# Patient Record
Sex: Female | Born: 1960 | Race: White | Hispanic: No | Marital: Married | Smoking: Former smoker
Health system: Southern US, Community
[De-identification: ages and names within clinical notes are randomized; demographics above are authoritative.]

## PROBLEM LIST (undated history)

## (undated) DIAGNOSIS — F419 Anxiety disorder, unspecified: Secondary | ICD-10-CM

## (undated) DIAGNOSIS — E785 Hyperlipidemia, unspecified: Secondary | ICD-10-CM

## (undated) DIAGNOSIS — I10 Essential (primary) hypertension: Secondary | ICD-10-CM

## (undated) HISTORY — DX: Anxiety disorder, unspecified: F41.9

## (undated) HISTORY — DX: Essential (primary) hypertension: I10

## (undated) HISTORY — DX: Hyperlipidemia, unspecified: E78.5

## (undated) HISTORY — PX: CHOLECYSTECTOMY: SHX55

---

## 2004-09-18 ENCOUNTER — Ambulatory Visit: Payer: Self-pay | Admitting: Family Medicine

## 2005-05-10 ENCOUNTER — Inpatient Hospital Stay (HOSPITAL_COMMUNITY): Admission: EM | Admit: 2005-05-10 | Discharge: 2005-05-12 | Payer: Self-pay

## 2005-05-10 ENCOUNTER — Encounter (INDEPENDENT_AMBULATORY_CARE_PROVIDER_SITE_OTHER): Payer: Self-pay | Admitting: Specialist

## 2005-05-11 ENCOUNTER — Ambulatory Visit: Payer: Self-pay | Admitting: Internal Medicine

## 2006-01-01 ENCOUNTER — Ambulatory Visit: Payer: Self-pay | Admitting: Family Medicine

## 2008-08-18 DIAGNOSIS — F5101 Primary insomnia: Secondary | ICD-10-CM | POA: Insufficient documentation

## 2009-01-31 ENCOUNTER — Ambulatory Visit: Payer: Self-pay | Admitting: Family Medicine

## 2009-01-31 LAB — HM MAMMOGRAPHY

## 2009-04-10 DIAGNOSIS — R0602 Shortness of breath: Secondary | ICD-10-CM

## 2009-04-10 DIAGNOSIS — R079 Chest pain, unspecified: Secondary | ICD-10-CM | POA: Insufficient documentation

## 2009-04-10 DIAGNOSIS — E7800 Pure hypercholesterolemia, unspecified: Secondary | ICD-10-CM

## 2009-04-17 ENCOUNTER — Ambulatory Visit: Payer: Self-pay | Admitting: Cardiovascular Disease

## 2010-03-27 NOTE — Assessment & Plan Note (Signed)
Summary: NEW PT   Visit Type:  New Patient Referring Provider:  Blythe Stanford, MD Primary Provider:  Blythe Stanford, MD  CC:  pain in neck, under lots of stress, and shortness of breath -  but out of breath. no edema in ankles and feet.Rebecca Greer  History of Present Illness: Ms. Rebecca Greer is a very pleasant 50 year old woman with a history of hyperlipidemia, shortness of breath with exertion, anxiety with recent episodes of chest discomfort radiating up to her shoulders she presents for evaluation. She is patient of Dr. Elease Hashimoto.  Ms. Windish states that she has started a new job at [employer] It is very stressful and she does have problems all day long. She started at 7:30 in the morning and finishes approximately at 5 PM and feels so tired that she can only sit and watch TV. She has no energy to do any walking or exercise. By the end of the week she is exhausted. She states having tightness in her chest that starts in the morning and lasts all day long into the evening. She was recently started on sertraline 50 mg daily and has been on it for less than one week. She's not noticed any significant changes. She does not have any significant chest discomfort with exertion. She is able to perform all of her ADLs without symptoms.  Preventive Screening-Counseling & Management  Alcohol-Tobacco     Alcohol drinks/day: 0     Smoking Status: quit     Packs/Day: 0.5     Year Quit: 1995  Caffeine-Diet-Exercise     Caffeine use/day: 1 cup      Does Patient Exercise: no      Drug Use:  no.    Current Problems (verified): 1)  Hypercholesterolemia Iia  (ICD-272.0) 2)  Shortness of Breath  (ICD-786.05) 3)  Chest Pain-unspecified  (ICD-786.50)  Current Medications (verified): 1)  Simvastatin 20 Mg Tabs (Simvastatin) .... Take One Tablet By Mouth Daily At Bedtime 2)  Zolpidem Tartrate 10 Mg Tabs (Zolpidem Tartrate) .Rebecca Greer.. 1 By Mouth Once Daily 3)  Sertraline Hcl 50 Mg Tabs (Sertraline Hcl) .Rebecca Greer.. 1 By Mouth Once  Daily  Allergies (verified): No Known Drug Allergies  Past History:  Past Medical History: Anxiety Hyperlipidemia Hypertension  Past Surgical History: gall bladder surgery  Family History: Father: living 45: heart problems Mother:living: healthy  Social History: Full Time Married  Tobacco Use - Former.  Alcohol Use - no Regular Exercise - no Drug Use - no Alcohol drinks/day:  0 Smoking Status:  quit Packs/Day:  0.5 Caffeine use/day:  1 cup  Does Patient Exercise:  no Drug Use:  no  Review of Systems       The patient complains of chest pain.  The patient denies anorexia, fever, weight loss, weight gain, vision loss, decreased hearing, hoarseness, syncope, dyspnea on exertion, peripheral edema, prolonged cough, headaches, hemoptysis, abdominal pain, melena, hematochezia, severe indigestion/heartburn, hematuria, incontinence, genital sores, muscle weakness, suspicious skin lesions, transient blindness, difficulty walking, depression, unusual weight change, abnormal bleeding, enlarged lymph nodes, angioedema, breast masses, and testicular masses.         Fatigue, anxiety  Vital Signs:  Patient profile:   50 year old female Height:      63 inches Weight:      177 pounds BMI:     31.47 Pulse rate:   82 / minute Pulse rhythm:   regular BP sitting:   130 / 86  (left arm) Cuff size:   regular  Vitals Entered  By: Mercer Pod (April 17, 2009 2:32 PM)  Physical Exam  General:  well-appearing young woman in no apparent distress, HEEN TMs benign, oropharynx is clear, neck is supple with no JVP or carotid bruits, heart sounds are regular with normal S1-S2 and no murmurs appreciated, lungs are clear to auscultation with no wheezes or rales, abdominal exam is benign, she has no significant lower extremity edema, neurologic exam is grossly nonfocal, skin is warm and dry, pulses are equal and symmetrical in her upper or lower extremities. She is alert and oriented  x3.    Impression & Recommendations:  Problem # 1:  CHEST PAIN-UNSPECIFIED (ICD-786.50) etiology of her chest pain is likely due to anxiety. It is atypical in presentation, lasting all day long and associated with rest. She has not had symptoms with exertion. There are no other associated symptoms with it including no sweating, shortness of breath or dizziness. I have encouraged her to increase her exercise as this may help her stress. I asked her to have her husband walk with her several times per week. If she has continued symptoms, with exertion, but asked her to contact our office for further evaluation. EKG is normal on today's visit.  Problem # 2:  SHORTNESS OF BREATH (ICD-786.05) I suspect that her shortness of breath is due to her deconditioned state. She even states herself that she is deconditioned. She needs to walk several times per week. Her weight is elevated and should be lower than its current level of 177 pounds.  Problem # 3:  HYPERCHOLESTEROLEMIA  IIA (ICD-272.0) she does have a family history of coronary artery disease. Father had bypass surgery in his early 58s. He was a long-time smoker. She stopped smoking 15 years ago. We'll try to obtain her most recent cholesterol panel from Dr. Elease Hashimoto for our records. Encouraged her to stay on the simvastatin given her family history and history of smoking. She is young for an aspirin though could consider this when she reaches 50 years old or sooner if diagnosed with coronary disease. Her updated medication list for this problem includes:    Simvastatin 20 Mg Tabs (Simvastatin) .Rebecca Greer... Take one tablet by mouth daily at bedtime

## 2010-03-27 NOTE — Progress Notes (Signed)
Summary: PHI  PHI   Imported By: Harlon Flor 04/19/2009 09:14:42  _____________________________________________________________________  External Attachment:    Type:   Image     Comment:   External Document

## 2010-07-13 NOTE — Op Note (Signed)
Rebecca Greer, Rebecca Greer NO.:  [id number]   MEDICAL RECORD NO.:  [id number]          PATIENT TYPE:  INP   LOCATION:  5703                         FACILITY:  [facility]   PHYSICIAN:  Clovis Pu. Cornett, M.D.DATE OF BIRTH:  [birth year]   DATE OF PROCEDURE:  05/10/2005  DATE OF DISCHARGE:  05/12/2005                                 OPERATIVE REPORT   PREOPERATIVE DIAGNOSIS:  Acute cholecystitis.   POSTOPERATIVE DIAGNOSIS:  Acute cholecystitis with common bile duct stone.   PROCEDURE:  Laparoscopic cholecystectomy with intraoperative cholangiogram.   SURGEON:  Harriette Bouillon, M.D.   ANESTHESIA:  General endotracheal anesthesia.   ESTIMATED BLOOD LOSS:  50 mL.   DRAIN:  A 19 Blake drain to gallbladder fossa.   SPECIMEN:  Gallbladder with gallstones to Pathology.   INDICATIONS FOR PROCEDURE:  The patient is a 50 year old female admitted  with upper quadrant pain and found to have signs of acute cholecystitis by  ultrasound and physical examination.  She had a slight bump in her liver  function studies as well.  She was brought to the operating room today for  laparoscopic cholecystectomy after informed consent that was obtained from  the patient.  Risks, complications and other alternative therapies were  discussed; the patient understood and agreed to proceed.   DESCRIPTION OF PROCEDURE:  The patient was brought to the operating room and  placed supine.  After induction of general endotracheal anesthesia, the  abdomen was prepped and draped in a sterile fashion.  A 1-cm supraumbilical  incision was made.  Dissection was carried down to her fascia.  A small  incision was made in the fascia and 2 graspers were used to grasp the fascia  and widen the incision.  I placed my finger through the incision through the  preperitoneal space into the peritoneal cavity and swept around.  A  pursestring suture of 0 Vicryl was placed and a 12-mm Hasson cannula was  placed under  direct vision.  Pneumoperitoneum was created to 15 mmHg with  CO2 and a laparoscope was placed.  Upon inspection of the abdominal cavity,  there were no signs of solid or hollow organ injury.  The gallbladder was  identified and found to show signs of acute inflammatory change.  A 5-mm  subxiphoid port was placed and 2 other 5-mm ports were placed in the right  mid-abdomen.  The dome of the gallbladder was identified and grasped; it was  quite tense and I had to decompress the gallbladder with a decompressing  suction needle.  This made much easier to grab.  The dome was then grasped  much easier now and retracted towards the patient's right shoulder.  Upon  inspection of the gallbladder, the common duct did appear dilated to a  centimeter.  I could see the cystic duct coursing off the common duct as  well.  I used the cautery at this point to score the peritoneum overlying  the junction of the cystic duct and common duct.  I was able to dissect out  the cystic duct circumferentially and it was the only tubular structure  entering the gallbladder.  Anterior to this was 1 small blood vessel that I  clipped and divided prior to dissecting out the cystic duct  circumferentially.  This was coursing on the cystic duct itself onto the  body of the gallbladder.  Once the cystic duct was identified, a clip was  placed on the gallbladder side.  Through a separate stab incision, a Cook  cholangiogram catheter was introduced.  A small incision was made in the  cystic duct and the cholangiogram catheter was placed and held in place with  a clip.  Intraoperative cholangiogram was obtained which showed free flow of  contrast material into the cystic duct and common duct to the duodenum.  There was flow up the common hepatic duct to the bifurcation of the right  and left hepatic ducts.  There was a filling defect that initially I thought  was an air bubble, but upon further inspection and repeat films,  this was  behaving more like a stone and I could not flush it out.  At this point in  time, I did not see any other filling defects, except for this one.  I  elected to go ahead and complete the cholangiogram.  I triple-clipped the  cystic duct stump and divided it.  The cystic artery was identified, double-  clipped and divided.  Cautery was used to dissect the gallbladder from the  gallbladder fossa with good hemostasis.  Using a 5-mm scope, an EndoCatch  bag was introduced through the umbilical port and the gallbladder was  subsequently removed and passed off the field.  I then reinspected the  gallbladder fossa with a 10-mm scope and used cautery to control any  bleeding.  Irrigation was used and suctioned out.  Clips were on the cystic  artery and cystic duct, respectively.  There was some edema from the  inflammation noted, but otherwise the gallbladder bed was dry.  I elected to  place a drain, since I had concern of a common duct stone, and through the  subxiphoid port, I was inserted 19 Blake drain and pulled this out of the  right lower quadrant through the right lower quadrant port.  I placed this  under the liver in the gallbladder fossa.  This was secured to the skin with  a 4-0 nylon.  At this point in time, I inspected the abdominal cavity and  saw no signs of active bleeding from the operative site nor injury to any  solid or hollow organs.  At this point in time, I then removed my ports, let  the CO2 escape and passed the camera off the field.  I then closed the  umbilical port with the previously placed pursestring suture of 0 Vicryl and  4-0 Monocryl was used to close all skin incisions.  The JP was placed was  placed to bulb-suction.  All final counts of sponge, needle and instruments  were found to be correct.  The patient was awoke and taken to Recovery in  satisfactory condition.      Thomas A. Cornett, M.D.  Electronically Signed    TAC/MEDQ  D:  05/10/2005   T:  05/13/2005  Job:  [id number]

## 2010-07-13 NOTE — Discharge Summary (Signed)
Rebecca Greer, Rebecca Greer NO.:  [id number]   MEDICAL RECORD NO.:  [id number]          PATIENT TYPE:  INP   LOCATION:  5703                         FACILITY:  [facility]   PHYSICIAN:  Clovis Pu. Cornett, M.D.DATE OF BIRTH:  18-Oct-1960   DATE OF ADMISSION:  05/09/2005  DATE OF DISCHARGE:  05/12/2005                                 DISCHARGE SUMMARY   ADMITTING DIAGNOSIS:  Cholecystitis.   DISCHARGE DIAGNOSIS:  Cholecystitis.   PROCEDURE PERFORMED:  1.  Laparoscopic cholecystectomy with cholangiogram.  2.  ERCP.   BRIEF HISTORY:  The patient is a 50 year old female admitted with  symptomatic cholelithiasis.  She also had elevated liver function studies  and was admitted for laparoscopic cholecystectomy.   HOSPITAL COURSE:  The patient underwent laparoscopic cholecystectomy on  May 10, 2005.  Cholangiogram revealed some possible filling defects and GI  was consulted.  ERCP did not reveal any filling defects after ERCP was done.  She was kept in the hospital until May 12, 2005.  She was afebrile, doing  relatively well, tolerating her diet.  She still had a slight increase in  her total bilirubin of 1.3, but her amylase and lipase were also elevated at  1590 and 2350.  She had no pain or tenderness though and was discharged home  on May 12, 2005.   DISCHARGE INSTRUCTIONS:  She will follow up in 10 to 14 days.  She will be  given Percocet for pain.  She will resume regular activities in 7 to 14 days  and resume a regular diet after 10 to 14 days.   CONDITION ON DISCHARGE:  Satisfactory.      Thomas A. Cornett, M.D.  Electronically Signed     TAC/MEDQ  D:  07/05/2005  T:  07/05/2005  Job:  [id number]

## 2010-07-13 NOTE — Consult Note (Signed)
Rebecca Greer, Rebecca Greer NO.:  [id number]   MEDICAL RECORD NO.:  [id number]          PATIENT TYPE:  INP   LOCATION:  5703                         FACILITY:  [facility]   PHYSICIAN:  Lina Sar, M.D. [facility]  DATE OF BIRTH:  03-05-1960   DATE OF CONSULTATION:  05/11/2005  DATE OF DISCHARGE:                                   CONSULTATION   PROCEDURE:  ERCP, sphincterotomy and stone extraction.   INDICATIONS FOR PROCEDURE:  This 50 year old white female was admitted on an  emergency basis with acute abdominal pain, evidence of cholelithiasis.  Her  liver function tests were normal.  She underwent a laparoscopic  cholecystectomy on May 10, 2005, with findings of an abnormal  intraoperative cholangiogram suggesting retained stone in common bile duct.  Liver function tests were slightly abnormal this morning and patient  continued to have low grade pain.  She is undergoing ERCP and possible stone  extraction.   ENDOSCOPE:  Olympus single channel side-viewing duodenoscope.   SEDATION:  Versed 125.5 mcg IV and Versed 11 mg IV.  Glucagon 1 mg IV.   FINDINGS:  Olympus single channel side-viewing duodenoscope passed blindly  through the esophagus into the stomach into the duodenum.  Papilla was  visualized without difficulty and showed normal configuration.  Initially,  no bile was exiting from the papilla.  Papilla was cannulated and initially  only main pancreatic duct showed which showed normal diameter and course in  the head, tail and body of the pancreas.  Surgical clips were noted in  multiple quadrants.  After moderate degree of difficulty, we were able to  cannulate the common bile duct with a guidewire and obtain a cholangiogram  which showed sludge in the common bile duct which was of normal size of 5 mm  in diameter.  Intrahepatic radicles were normal.  Cystic duct remnant was  apparent.  At that point, moderate size endoscopic sphincterotomy was  carried out to  facilitate passage of the stone.  Next, the Wilson-Cook  inflatable balloon passed over the guidewire through the common bile duct up  to the proximal hepatic duct, was insufflated in the common bile duct and  several sweeps of the bile duct were obtained with delivery of a thick bile  and some sludge.  No definite stone was seen.  Occlusion cholangiogram after  the common bile duct sweep showed clear common bile duct without evidence of  stones.  Patient tolerated the procedure well.   IMPRESSION:  1.  Status post endoscopic sphincterotomy for suspected retained common bile      duct stone.  2.  Status post sweep of the common bile duct with normal post sweep      cholangiogram.  3.  Status post remote laparoscopic cholecystectomy.  4.  Normal pancreatic duct.   PLAN:  Patient will be observed today and we will obtain liver function  tests and pancreatic test tomorrow and continue on Cipro.      Lina Sar, M.D. [facility]  Electronically Signed     DB/MEDQ  D:  05/11/2005  T:  05/13/2005  Job:  [id number]   cc:   Thomas A. Cornett, M.D.  [address]  [place] [address]

## 2010-07-13 NOTE — H&P (Signed)
NAME:  Rebecca Greer, Rebecca Greer NO.:  [id number]   MEDICAL RECORD NO.:  [id number]          PATIENT TYPE:  EMS   LOCATION:  MAJO                         FACILITY:  [facility]   PHYSICIAN:  Gabrielle Dare. Janee Morn, M.D.DATE OF BIRTH:  [birth year]   DATE OF ADMISSION:  05/09/2005  DATE OF DISCHARGE:                                HISTORY & PHYSICAL   CHIEF COMPLAINT:  Right upper quadrant abdominal pain.   HISTORY OF PRESENT ILLNESS:  The patient is a 50 year old white female who  developed right upper quadrant abdominal pain yesterday at 2 p.m.  She has  had one similar episode in the past but it was milder and resolved  spontaneously.  This time the pain persisted.  She had no associated nausea  and vomiting but she did come to [facility] emergency department for further  evaluation.  Workup here shows white blood cell count 13.1.  Liver function  tests within normal limits.  Ultrasound of the abdomen demonstrates a  gallbladder with gallstones, gallbladder wall thickening and edema, and a  sonographic Murphy sign consistent with acute cholecystitis.  She has  continued to have some discomfort in the right upper quadrant.   PAST MEDICAL HISTORY:  She denies.   PAST SURGICAL HISTORY:  Tubal ligation.   CURRENT MEDICATIONS:  Zyrtec and trazodone with doses listed on the chart.   SOCIAL HISTORY:  She does not smoke or drink alcohol.   ALLERGIES:  No known drug allergies.   REVIEW OF SYSTEMS:  CARDIAC:  Negative.  PULMONARY:  Negative.  GI:  As  above.  GU:  Negative.  MUSCULOSKELETAL:  Negative.   PHYSICAL EXAMINATION:  VITAL SIGNS:  Temperature is 97.1, blood pressure  152/100, pulse 95, respirations 18.  GENERAL:  She is awake, alert, and well-appearing.  HEENT:  Sclerae is clear with no icterus.  NECK:  Supple with no masses.  LUNGS:  Clear to auscultation.  HEART:  Regular.  Impulse is palpable in the left chest.  ABDOMEN:  Soft.  She has some tenderness in the right upper  quadrant without  guarding or peritoneal signs.  Bowel sounds are hypoactive.  SKIN:  Warm and dry.   DATA REVIEW:  Includes a urinalysis which is negative.  White blood cell  count 13.1, hemoglobin 14, platelets 187.  Liver function tests within  normal limits.  Lipase is 27.   IMPRESSION:  A 49 year old white female with acute cholecystitis.   PLAN:  1.  Admit her to the hospital, give her IV antibiotics, and keep her NPO.  2.  We will plan on proceeding with laparoscopic cholecystectomy later      today.  3.  I will discuss this case in detail with my partner, Dr. Luisa Hart, who is      working today.   The procedure risks and benefits were discussed in detail with the patient  and she is agreeable.  Their questions were answered.      Gabrielle Dare Janee Morn, M.D.  Electronically Signed     BET/MEDQ  D:  05/10/2005  T:  05/11/2005  Job:  [id number]

## 2010-09-05 ENCOUNTER — Encounter: Payer: Self-pay | Admitting: Cardiovascular Disease

## 2012-10-19 LAB — HM PAP SMEAR
HM PAP: NORMAL
HM Pap smear: NEGATIVE

## 2012-12-16 ENCOUNTER — Ambulatory Visit: Payer: Self-pay | Admitting: Gastroenterology

## 2012-12-16 LAB — HM COLONOSCOPY: HM Colonoscopy: NORMAL

## 2014-11-16 ENCOUNTER — Other Ambulatory Visit: Payer: Self-pay | Admitting: Family Medicine

## 2014-11-16 DIAGNOSIS — R739 Hyperglycemia, unspecified: Secondary | ICD-10-CM | POA: Insufficient documentation

## 2014-11-16 DIAGNOSIS — E7800 Pure hypercholesterolemia, unspecified: Secondary | ICD-10-CM

## 2014-11-16 DIAGNOSIS — R7303 Prediabetes: Secondary | ICD-10-CM | POA: Insufficient documentation

## 2014-12-10 ENCOUNTER — Other Ambulatory Visit: Payer: Self-pay | Admitting: Family Medicine

## 2014-12-10 DIAGNOSIS — F4322 Adjustment disorder with anxiety: Secondary | ICD-10-CM

## 2014-12-12 ENCOUNTER — Other Ambulatory Visit: Payer: Self-pay

## 2014-12-12 DIAGNOSIS — F4322 Adjustment disorder with anxiety: Secondary | ICD-10-CM

## 2014-12-12 DIAGNOSIS — I10 Essential (primary) hypertension: Secondary | ICD-10-CM | POA: Insufficient documentation

## 2014-12-12 MED ORDER — ALPRAZOLAM 0.5 MG PO TABS: ORAL_TABLET | ORAL | Status: DC

## 2014-12-12 NOTE — Telephone Encounter (Signed)
Printed, please fax or call in to pharmacy. Thank you.   

## 2015-01-02 ENCOUNTER — Ambulatory Visit (INDEPENDENT_AMBULATORY_CARE_PROVIDER_SITE_OTHER): Payer: 59 | Admitting: Family Medicine

## 2015-01-02 ENCOUNTER — Encounter: Payer: Self-pay | Admitting: Family Medicine

## 2015-01-02 VITALS — BP 126/68 | HR 88 | Temp 97.8°F | Resp 16 | Ht 63.5 in | Wt 149.0 lb

## 2015-01-02 DIAGNOSIS — Z1231 Encounter for screening mammogram for malignant neoplasm of breast: Secondary | ICD-10-CM

## 2015-01-02 DIAGNOSIS — Z23 Encounter for immunization: Secondary | ICD-10-CM

## 2015-01-02 DIAGNOSIS — E7800 Pure hypercholesterolemia, unspecified: Secondary | ICD-10-CM | POA: Diagnosis not present

## 2015-01-02 DIAGNOSIS — Z0000 Encounter for general adult medical examination without abnormal findings: Secondary | ICD-10-CM

## 2015-01-02 DIAGNOSIS — J309 Allergic rhinitis, unspecified: Secondary | ICD-10-CM | POA: Diagnosis not present

## 2015-01-02 DIAGNOSIS — R739 Hyperglycemia, unspecified: Secondary | ICD-10-CM

## 2015-01-02 DIAGNOSIS — I10 Essential (primary) hypertension: Secondary | ICD-10-CM | POA: Diagnosis not present

## 2015-01-02 DIAGNOSIS — F4322 Adjustment disorder with anxiety: Secondary | ICD-10-CM | POA: Diagnosis not present

## 2015-01-02 LAB — POCT URINALYSIS DIPSTICK
Bilirubin, UA: NEGATIVE
Blood, UA: NEGATIVE
Glucose, UA: NEGATIVE
KETONES UA: NEGATIVE
LEUKOCYTES UA: NEGATIVE
Nitrite, UA: NEGATIVE
PH UA: 6.5
PROTEIN UA: NEGATIVE
SPEC GRAV UA: 1.01
UROBILINOGEN UA: 0.2

## 2015-01-02 MED ORDER — SERTRALINE HCL 50 MG PO TABS: 50.0000 mg | ORAL_TABLET | Freq: Every day | ORAL | Status: DC

## 2015-01-02 NOTE — Progress Notes (Signed)
Patient ID: Rebecca Greer, female   DOB: 1960/09/01, 54 y.o.   MRN: [id number]         Patient: Rebecca Greer, Female    DOB: 08-Aug-1960, 54 y.o.   MRN: [id number] Visit Date: 01/02/2015  Today's Provider: Lorie Phenix, MD   Chief Complaint  Patient presents with  . Annual Exam   Subjective:    Annual physical exam Rebecca Greer is a 54 y.o. female who presents today for health maintenance and complete physical. She feels well. She reports exercising fairly regular. She reports she is sleeping well. Chronic problems stable.  Chronic problems are stable. Taking her cholesterol medication without any problems.  Also, currently not taking hypertension medication. Also, tried to taper her Zoloft, but was too emotional. Did not feel good. Not sure if she tapered too soon. Is not sure if she needs it.     -----------------------------------------------------------------   Review of Systems  Constitutional: Negative.   HENT: Negative.   Eyes: Negative.   Respiratory: Negative.   Cardiovascular: Negative.   Gastrointestinal: Negative.   Genitourinary: Negative.   Musculoskeletal: Negative.   Skin: Negative.   Allergic/Immunologic: Positive for environmental allergies. Negative for food allergies and immunocompromised state.  Neurological: Negative.   Hematological: Negative.   Psychiatric/Behavioral: Negative.     Social History She  reports that she has quit smoking. She has never used smokeless tobacco. She reports that she drinks alcohol. She reports that she does not use illicit drugs. Social History   Social History  . Marital Status: Married    Spouse Name: N/A  . Number of Children: 2  . Years of Education: College   Occupational History  . Full time    Social History Main Topics  . Smoking status: Former Games developer  . Smokeless tobacco: Never Used  . Alcohol Use: Yes     Comment: Rarely  . Drug Use: No  . Sexual Activity: Not Asked   Other Topics  Concern  . None   Social History Narrative   Married   Does not get regular exercise    Patient Active Problem List   Diagnosis Date Noted  . BP (high blood pressure) 12/12/2014  . Blood glucose elevated 11/16/2014  . SHORTNESS OF BREATH 04/10/2009  . CHEST PAIN-UNSPECIFIED 04/10/2009  . Adjustment disorder with anxiety 04/07/2009  . Hypercholesteremia 12/23/2008  . Allergic rhinitis 11/16/2008  . Cannot sleep 08/18/2008    Past Surgical History  Procedure Laterality Date  . Cholecystectomy      Family History  Family Status  Relation Status Death Age  . Father Deceased 21  . Mother Alive    Her family history includes Heart disease in her father; Hyperlipidemia in her mother; Hypertension in her mother; Melanoma in her father; Skin cancer in her father.    No Known Allergies  Previous Medications   ALPRAZOLAM (XANAX) 0.5 MG TABLET    Take 1/2 to 1 at bedtime as needed.   MULTIPLE VITAMIN (MULTIVITAMIN) CAPSULE    Take 1 capsule by mouth daily.   PRAVASTATIN (PRAVACHOL) 20 MG TABLET    Take 1 tablet by mouth  nightly   SERTRALINE (ZOLOFT) 100 MG TABLET    Take 2 tablets by mouth  daily   TRIAMCINOLONE (NASACORT ALLERGY 24HR) 55 MCG/ACT AERO NASAL INHALER    Place into the nose.    Patient Care Team: Lorie Phenix, MD as PCP - General (Family Medicine)     Objective:   Vitals: BP 126/68  mmHg  Pulse 88  Temp(Src) 97.8 F (36.6 C) (Oral)  Resp 16  Ht 5' 3.5" (1.613 m)  Wt 149 lb (67.586 kg)  BMI 25.98 kg/m2  LMP 12/10/2014 (Within Days)   Physical Exam  Constitutional: She is oriented to person, place, and time. She appears well-developed and well-nourished.  HENT:  Head: Normocephalic and atraumatic.  Right Ear: Tympanic membrane, external ear and ear canal normal.  Left Ear: Tympanic membrane, external ear and ear canal normal.  Nose: Nose normal.  Mouth/Throat: Uvula is midline, oropharynx is clear and moist and mucous membranes are normal.  Eyes:  Conjunctivae, EOM and lids are normal. Pupils are equal, round, and reactive to light. Lids are everted and swept, no foreign bodies found.  Neck: Trachea normal and normal range of motion. Carotid bruit is not present.  Cardiovascular: Normal rate, regular rhythm, normal heart sounds and normal pulses.   Pulmonary/Chest: Effort normal and breath sounds normal. Right breast exhibits no inverted nipple, no mass, no nipple discharge, no skin change and no tenderness. Left breast exhibits no inverted nipple, no mass, no nipple discharge, no skin change and no tenderness. Breasts are symmetrical.  Abdominal: Soft. Normal appearance and normal aorta. There is no tenderness.  Musculoskeletal: Normal range of motion.  Lymphadenopathy:    She has no cervical adenopathy.    She has no axillary adenopathy.  Neurological: She is alert and oriented to person, place, and time.  Skin: Skin is warm, dry and intact.  Psychiatric: She has a normal mood and affect. Her speech is normal and behavior is normal. Judgment and thought content normal. Cognition and memory are normal.     Depression Screen No flowsheet data found.    Assessment & Plan:     Routine Health Maintenance and Physical Exam  Exercise Activities and Dietary recommendations Goals    None      There is no immunization history for the selected administration types on file for this patient.  Health Maintenance  Topic Date Due  . Hepatitis C Screening  17-Jun-1960  . HIV Screening  04/09/1977  . TETANUS/TDAP  04/09/1981  . PAP SMEAR  04/10/1983  . MAMMOGRAM  04/09/2012  . COLONOSCOPY  04/09/2012  . INFLUENZA VACCINE  09/26/2014      Discussed health benefits of physical activity, and encouraged her to engage in regular exercise appropriate for her age and condition.   1. Annual physical exam Eat healthy and exercise.   - POCT urinalysis dipstick  2. Essential hypertension Stable, off medication currently .   -  TSH  3. Allergic rhinitis, unspecified allergic rhinitis type Stable.  - CBC with Differential/Platelet  4. Blood glucose elevated Will check labs.  - Comprehensive metabolic panel - Hemoglobin A1c  5. Hypercholesteremia Will check labs.  Sable. Continue medication.  No side effects from medication.   - Lipid panel  6. Flu vaccine need Given today.  - Flu Vaccine QUAD 36+ mos PF IM (Fluarix & Fluzone Quad PF)  7. Encounter for screening mammogram for breast cancer Will call and schedule.   - MM Digital Screening  8. Adjustment disorder with anxiety Stable, Did not tolerate stopping. Will decrease to 50 mg and taper further from there.   - sertraline (ZOLOFT) 50 MG tablet; Take 1 tablet (50 mg total) by mouth daily.  Dispense: 90 tablet; Refill: 3   Patient was seen and examined by Leo Grosser, MD, and note scribed by Kavin Leech, CMA.  .I have reviewed  the document for accuracy and completeness and I agree with above. Leo Grosser- Diksha Tagliaferro J. Tamiki Kuba, MD   Lorie PhenixNancy Meliah Appleman, MD    --------------------------------------------------------------------

## 2015-01-24 ENCOUNTER — Ambulatory Visit: Payer: Self-pay | Attending: Family Medicine

## 2015-01-28 ENCOUNTER — Other Ambulatory Visit: Payer: Self-pay | Admitting: Family Medicine

## 2015-01-28 DIAGNOSIS — E7800 Pure hypercholesterolemia, unspecified: Secondary | ICD-10-CM

## 2015-06-23 ENCOUNTER — Other Ambulatory Visit: Payer: Self-pay

## 2015-06-23 DIAGNOSIS — E7800 Pure hypercholesterolemia, unspecified: Secondary | ICD-10-CM

## 2015-06-23 DIAGNOSIS — F4322 Adjustment disorder with anxiety: Secondary | ICD-10-CM

## 2015-06-23 MED ORDER — PRAVASTATIN SODIUM 20 MG PO TABS: 20.0000 mg | ORAL_TABLET | Freq: Every day | ORAL | Status: DC

## 2015-06-23 MED ORDER — SERTRALINE HCL 50 MG PO TABS: 50.0000 mg | ORAL_TABLET | Freq: Every day | ORAL | Status: DC

## 2015-06-23 MED ORDER — ALPRAZOLAM 0.5 MG PO TABS: ORAL_TABLET | ORAL | Status: DC

## 2015-06-23 NOTE — Telephone Encounter (Signed)
Patient is requesting all her meds be sent to mail order pharmacy and be filled until she finds another PCP. Patient reports that she is completley out of the Xanax. Thanks! Patient's last OV was 12/2014.

## 2015-06-27 ENCOUNTER — Other Ambulatory Visit: Payer: Self-pay

## 2015-06-27 DIAGNOSIS — F4322 Adjustment disorder with anxiety: Secondary | ICD-10-CM

## 2015-06-27 MED ORDER — ALPRAZOLAM 0.5 MG PO TABS: ORAL_TABLET | ORAL | Status: DC

## 2015-06-27 NOTE — Telephone Encounter (Signed)
Printed, please fax or call in to pharmacy. Thank you.   

## 2015-07-03 ENCOUNTER — Encounter: Payer: Self-pay | Admitting: Family Medicine

## 2015-07-03 ENCOUNTER — Ambulatory Visit (INDEPENDENT_AMBULATORY_CARE_PROVIDER_SITE_OTHER): Payer: 59 | Admitting: Family Medicine

## 2015-07-03 ENCOUNTER — Ambulatory Visit
Admission: RE | Admit: 2015-07-03 | Discharge: 2015-07-03 | Disposition: A | Discharge status: Home / Self Care | Payer: 59 | Source: Ambulatory Visit | Attending: Family Medicine | Admitting: Family Medicine

## 2015-07-03 VITALS — BP 118/74 | HR 80 | Temp 97.9°F | Resp 16 | Wt 164.0 lb

## 2015-07-03 DIAGNOSIS — R0789 Other chest pain: Secondary | ICD-10-CM

## 2015-07-03 DIAGNOSIS — F4322 Adjustment disorder with anxiety: Secondary | ICD-10-CM | POA: Diagnosis not present

## 2015-07-03 MED ORDER — ALPRAZOLAM 0.5 MG PO TABS: ORAL_TABLET | ORAL | Status: DC

## 2015-07-03 NOTE — Progress Notes (Signed)
Subjective:    Patient ID: Rebecca Greer, female    DOB: Aug 27, 1960, 55 y.o.   MRN: [id number]  Chest Pain  This is a chronic problem. The current episode started more than 1 year ago (was referred to Dr. Mariah MillingGollan in 2011 for this problem). The problem has been unchanged (pain is becoming more frequent). The pain is present in the lateral region (left). The pain is at a severity of 1/10. The pain is mild. The quality of the pain is described as pressure ("feels like it's bruised inside"). The pain does not radiate. Pertinent negatives include no abdominal pain, back pain, claudication, cough, diaphoresis, dizziness, exertional chest pressure, fever, headaches, hemoptysis, irregular heartbeat, leg pain, lower extremity edema, malaise/fatigue, nausea, near-syncope, numbness, orthopnea, palpitations, shortness of breath, sputum production, syncope, vomiting or weakness. Associated with: pt reports she does not know if the pain is creating anxiety, or vice versa. Treatments tried: Sertraline, Xanax, seeing cardiology. The treatment provided mild relief.      Review of Systems  Constitutional: Negative for fever, malaise/fatigue and diaphoresis.  Respiratory: Negative for cough, hemoptysis, sputum production and shortness of breath.   Cardiovascular: Positive for chest pain. Negative for palpitations, orthopnea, claudication, syncope and near-syncope.  Gastrointestinal: Negative for nausea, vomiting and abdominal pain.  Musculoskeletal: Negative for back pain.  Neurological: Negative for dizziness, weakness, numbness and headaches.   BP 118/74 mmHg  Pulse 80  Temp(Src) 97.9 F (36.6 C) (Oral)  Resp 16  Wt 164 lb (74.39 kg)  SpO2 98%  LMP 06/19/2015   Patient Active Problem List   Diagnosis Date Noted  . BP (high blood pressure) 12/12/2014  . Blood glucose elevated 11/16/2014  . SHORTNESS OF BREATH 04/10/2009  . CHEST PAIN-UNSPECIFIED 04/10/2009  . Adjustment disorder with anxiety  04/07/2009  . Hypercholesteremia 12/23/2008  . Allergic rhinitis 11/16/2008  . Cannot sleep 08/18/2008   Past Medical History  Diagnosis Date  . Anxiety   . Hyperlipidemia   . Hypertension    Current Outpatient Prescriptions on File Prior to Visit  Medication Sig  . ALPRAZolam (XANAX) 0.5 MG tablet Take 1/2 to 1 at bedtime as needed.  . pravastatin (PRAVACHOL) 20 MG tablet Take 1 tablet (20 mg total) by mouth daily.  . sertraline (ZOLOFT) 50 MG tablet Take 1 tablet (50 mg total) by mouth daily.  Marland Kitchen. triamcinolone (NASACORT ALLERGY 24HR) 55 MCG/ACT AERO nasal inhaler Place into the nose.   No current facility-administered medications on file prior to visit.   No Known Allergies Past Surgical History  Procedure Laterality Date  . Cholecystectomy     Social History   Social History  . Marital Status: Married    Spouse Name: N/A  . Number of Children: 2  . Years of Education: College   Occupational History  . Full time    Social History Main Topics  . Smoking status: Former Smoker    Quit date: 02/24/1994  . Smokeless tobacco: Never Used  . Alcohol Use: Yes     Comment: Rarely  . Drug Use: No  . Sexual Activity: Not on file   Other Topics Concern  . Not on file   Social History Narrative   Married   Does not get regular exercise   Family History  Problem Relation Age of Onset  . Heart disease Father   . Skin cancer Father   . Melanoma Father   . Hypertension Mother   . Hyperlipidemia Mother  Objective:   Physical Exam  Cardiovascular: Normal rate and regular rhythm.   Carotids are normal  Pulmonary/Chest: Effort normal and breath sounds normal. No respiratory distress.  Abdominal: Soft. Bowel sounds are normal. She exhibits no distension. There is no tenderness.  Psychiatric: She has a normal mood and affect.  BP 118/74 mmHg  Pulse 80  Temp(Src) 97.9 F (36.6 C) (Oral)  Resp 16  Wt 164 lb (74.39 kg)  SpO2 98%  LMP 06/19/2015     Assessment  & Plan:  1. Adjustment disorder with anxiety Not to goal. Faxed refill to mail order pharmacy as below. - ALPRAZolam (XANAX) 0.5 MG tablet; Take 1/2 to 1 at bedtime as needed.  Dispense: 90 tablet; Refill: 1  2. Atypical chest pain Worsening. EKG WNL. Most likely secondary to anxiety. Will order CXR as below. If normal, will proceed with CT. - EKG 12-Lead - DG Chest 2 View; Future    Patient seen and examined by Leo Grosser, MD, and note scribed by Allene Dillon, CMA.   I have reviewed the document for accuracy and completeness and I agree with above. Leo Grosser, MD   Lorie Phenix, MD

## 2015-07-04 ENCOUNTER — Telehealth: Payer: Self-pay

## 2015-07-04 DIAGNOSIS — R0789 Other chest pain: Secondary | ICD-10-CM | POA: Insufficient documentation

## 2015-07-04 NOTE — Telephone Encounter (Signed)
-----   Message from Lorie PhenixNancy Maloney, MD sent at 07/04/2015  6:58 AM EDT ----- Normal exam. Please proceed with chest CT with contrast secondary to atypical chest pain and notify patient. Thanks.

## 2015-07-04 NOTE — Telephone Encounter (Signed)
Informed pt and ordered CT. Allene DillonEmily Drozdowski, CMA

## 2015-07-04 NOTE — Telephone Encounter (Signed)
LMTCB Emily Drozdowski, CMA  

## 2015-07-10 ENCOUNTER — Ambulatory Visit: Admission: RE | Admit: 2015-07-10 | Payer: 59 | Source: Ambulatory Visit

## 2015-08-16 ENCOUNTER — Encounter: Payer: Self-pay | Admitting: Family Medicine

## 2015-08-16 ENCOUNTER — Ambulatory Visit (INDEPENDENT_AMBULATORY_CARE_PROVIDER_SITE_OTHER): Payer: 59 | Admitting: Family Medicine

## 2015-08-16 VITALS — BP 110/66 | HR 72 | Temp 98.0°F | Resp 16 | Ht 64.0 in | Wt 165.0 lb

## 2015-08-16 DIAGNOSIS — F4322 Adjustment disorder with anxiety: Secondary | ICD-10-CM | POA: Diagnosis not present

## 2015-08-16 DIAGNOSIS — R0789 Other chest pain: Secondary | ICD-10-CM | POA: Diagnosis not present

## 2015-08-16 MED ORDER — ESCITALOPRAM OXALATE 10 MG PO TABS: 10.0000 mg | ORAL_TABLET | Freq: Every day | ORAL | Status: DC

## 2015-08-16 MED ORDER — OMEPRAZOLE 20 MG PO CPDR: 20.0000 mg | DELAYED_RELEASE_CAPSULE | Freq: Every day | ORAL | Status: DC

## 2015-08-16 MED ORDER — ALPRAZOLAM 0.5 MG PO TABS: ORAL_TABLET | ORAL | Status: DC

## 2015-08-16 NOTE — Progress Notes (Signed)
Patient: Rebecca Greer Female    DOB: 25-Mar-1960   55 y.o.   MRN: [id number] Visit Date: 08/16/2015  Today's Provider: Lorie PhenixNancy Khya Halls, MD   Chief Complaint  Patient presents with  . Follow-up   Subjective:    HPI  Follow up for chest pain  The patient was last seen for this 6 weeks ago. Changes made at last visit include EKG, and X-ray. Patient did not do CT scan of chest.  She reports starting OTC Zantac BID, reports excellent compliance with treatment. She feels that condition is Improved. She is not having side effects. Patient reports chest pain in the mornings. Patient denies nausea, vomiting or heart burn. Patient denies symptoms are worse with food. ------------------------------------------------------------------------------------  Anxiety: Patient complains of anxiety disorder.  She has the following symptoms: chest pain, insomnia, irritable. Onset of symptoms was approximately several years ago, gradually worsening since that time. She denies current suicidal and homicidal ideation. Family history significant for alcoholism.Possible organic causes contributing are: none. Risk factors: work Previous treatment includes Xanax and sertraline.  She complains of the following side effects from the treatment: none. Xanax 1 tablet at bedtime and sertraline 50 mg daily, reports no improvmnet.       No Known Allergies Current Meds  Medication Sig  . ALPRAZolam (XANAX) 0.5 MG tablet Take 1/2 to 1 at bedtime as needed.  . pravastatin (PRAVACHOL) 20 MG tablet Take 1 tablet (20 mg total) by mouth daily.  . ranitidine (ZANTAC) 150 MG capsule Take 150 mg by mouth 2 (two) times daily.  . sertraline (ZOLOFT) 50 MG tablet Take 1 tablet (50 mg total) by mouth daily.  Marland Kitchen. triamcinolone (NASACORT ALLERGY 24HR) 55 MCG/ACT AERO nasal inhaler Place into the nose.    Review of Systems  Constitutional: Negative.   Cardiovascular: Positive for chest pain.  Gastrointestinal: Negative.    Psychiatric/Behavioral: Positive for sleep disturbance and agitation. The patient is nervous/anxious.     Social History  Substance Use Topics  . Smoking status: Former Smoker    Quit date: 02/24/1994  . Smokeless tobacco: Never Used  . Alcohol Use: Yes     Comment: Rarely   Objective:   BP 110/66 mmHg  Pulse 72  Temp(Src) 98 F (36.7 C) (Oral)  Resp 16  Ht 5\' 4"  (1.626 m)  Wt 165 lb (74.844 kg)  BMI 28.31 kg/m2  LMP 07/27/2015 (Approximate)  Physical Exam  Constitutional: She is oriented to person, place, and time. She appears well-developed and well-nourished.  Neurological: She is alert and oriented to person, place, and time.        Assessment & Plan:      1. Adjustment disorder with anxiety Recurrent. Worsening. Patient started on escitalopram 10 mg as below. Patient to follow-up in 2 weeks with Daiva NakayamaJenni Burnette.  Did meet her in office today.   Also refilled her Xanax to take as needed today. Will hold off on CT scan for now.   Does feel anxiety is large contributor to her pain at this point.   - escitalopram (LEXAPRO) 10 MG tablet; Take 1 tablet (10 mg total) by mouth daily. Take 1/2 tablet for 1 week then 1 tablet daily  Dispense: 30 tablet; Refill: 1 - ALPRAZolam (XANAX) 0.5 MG tablet; Take 1-2 tablets at bedtime as needed.  Dispense: 180 tablet; Refill: 3  2. Atypical chest pain Recurrent. Improving. Patient started on omeprazole 20 mg daily as below. Continue current medication.  - omeprazole (PRILOSEC)  20 MG capsule; Take 1 capsule (20 mg total) by mouth daily.  Dispense: 30 capsule; Refill: 1    Patient seen and examined by Dr. Leo Grosser, and note scribed by Liz Beach. Dimas, CMA.  I have reviewed the document for accuracy and completeness and I agree with above. - Leo Grosser, MD   Lorie Phenix, MD  [facility]

## 2015-09-04 ENCOUNTER — Ambulatory Visit (INDEPENDENT_AMBULATORY_CARE_PROVIDER_SITE_OTHER): Payer: 59 | Admitting: Physician Assistant

## 2015-09-04 ENCOUNTER — Encounter: Payer: Self-pay | Admitting: Physician Assistant

## 2015-09-04 VITALS — BP 110/78 | HR 86 | Temp 97.8°F | Resp 16 | Wt 167.0 lb

## 2015-09-04 DIAGNOSIS — F4322 Adjustment disorder with anxiety: Secondary | ICD-10-CM

## 2015-09-04 NOTE — Patient Instructions (Signed)
Escitalopram tablets  What is this medicine?  ESCITALOPRAM (es sye TAL oh pram) is used to treat depression and certain types of anxiety.  This medicine may be used for other purposes; ask your health care provider or pharmacist if you have questions.  What should I tell my health care provider before I take this medicine?  They need to know if you have any of these conditions:  -bipolar disorder or a family history of bipolar disorder  -diabetes  -glaucoma  -heart disease  -kidney or liver disease  -receiving electroconvulsive therapy  -seizures (convulsions)  -suicidal thoughts, plans, or attempt by you or a family member  -an unusual or allergic reaction to escitalopram, the related drug citalopram, other medicines, foods, dyes, or preservatives  -pregnant or trying to become pregnant  -breast-feeding  How should I use this medicine?  Take this medicine by mouth with a glass of water. Follow the directions on the prescription label. You can take it with or without food. If it upsets your stomach, take it with food. Take your medicine at regular intervals. Do not take it more often than directed. Do not stop taking this medicine suddenly except upon the advice of your doctor. Stopping this medicine too quickly may cause serious side effects or your condition may worsen.  A special MedGuide will be given to you by the pharmacist with each prescription and refill. Be sure to read this information carefully each time.  Talk to your pediatrician regarding the use of this medicine in children. Special care may be needed.  Overdosage: If you think you have taken too much of this medicine contact a poison control center or emergency room at once.  NOTE: This medicine is only for you. Do not share this medicine with others.  What if I miss a dose?  If you miss a dose, take it as soon as you can. If it is almost time for your next dose, take only that dose. Do not take double or extra doses.  What may interact with this  medicine?  Do not take this medicine with any of the following medications:  -certain medicines for fungal infections like fluconazole, itraconazole, ketoconazole, posaconazole, voriconazole  -cisapride  -citalopram  -dofetilide  -dronedarone  -linezolid  -MAOIs like Carbex, Eldepryl, Marplan, Nardil, and Parnate  -methylene blue (injected into a vein)  -pimozide  -thioridazine  -ziprasidone  This medicine may also interact with the following medications:  -alcohol  -aspirin and aspirin-like medicines  -carbamazepine  -certain medicines for depression, anxiety, or psychotic disturbances  -certain medicines for migraine headache like almotriptan, eletriptan, frovatriptan, naratriptan, rizatriptan, sumatriptan, zolmitriptan  -certain medicines for sleep  -certain medicines that treat or prevent blood clots like warfarin, enoxaparin, dalteparin  -cimetidine  -diuretics  -fentanyl  -furazolidone  -isoniazid  -lithium  -metoprolol  -NSAIDs, medicines for pain and inflammation, like ibuprofen or naproxen  -other medicines that prolong the QT interval (cause an abnormal heart rhythm)  -procarbazine  -rasagiline  -supplements like St. John's wort, kava kava, valerian  -tramadol  -tryptophan  This list may not describe all possible interactions. Give your health care provider a list of all the medicines, herbs, non-prescription drugs, or dietary supplements you use. Also tell them if you smoke, drink alcohol, or use illegal drugs. Some items may interact with your medicine.  What should I watch for while using this medicine?  Tell your doctor if your symptoms do not get better or if they get worse. Visit your doctor   or health care professional for regular checks on your progress. Because it may take several weeks to see the full effects of this medicine, it is important to continue your treatment as prescribed by your doctor.  Patients and their families should watch out for new or worsening thoughts of suicide or  depression. Also watch out for sudden changes in feelings such as feeling anxious, agitated, panicky, irritable, hostile, aggressive, impulsive, severely restless, overly excited and hyperactive, or not being able to sleep. If this happens, especially at the beginning of treatment or after a change in dose, call your health care professional.  You may get drowsy or dizzy. Do not drive, use machinery, or do anything that needs mental alertness until you know how this medicine affects you. Do not stand or sit up quickly, especially if you are an older patient. This reduces the risk of dizzy or fainting spells. Alcohol may interfere with the effect of this medicine. Avoid alcoholic drinks.  Your mouth may get dry. Chewing sugarless gum or sucking hard candy, and drinking plenty of water may help. Contact your doctor if the problem does not go away or is severe.  What side effects may I notice from receiving this medicine?  Side effects that you should report to your doctor or health care professional as soon as possible:  -allergic reactions like skin rash, itching or hives, swelling of the face, lips, or tongue  -confusion  -feeling faint or lightheaded, falls  -fast talking and excited feelings or actions that are out of control  -hallucination, loss of contact with reality  -seizures  -suicidal thoughts or other mood changes  -unusual bleeding or bruising  Side effects that usually do not require medical attention (report to your doctor or health care professional if they continue or are bothersome):  -blurred vision  -changes in appetite  -change in sex drive or performance  -headache  -increased sweating  -nausea  This list may not describe all possible side effects. Call your doctor for medical advice about side effects. You may report side effects to FDA at 1-800-FDA-1088.  Where should I keep my medicine?  Keep out of reach of children.  Store at room temperature between 15 and 30 degrees C (59 and 86 degrees  F). Throw away any unused medicine after the expiration date.  NOTE: This sheet is a summary. It may not cover all possible information. If you have questions about this medicine, talk to your doctor, pharmacist, or health care provider.     © 2016, [employer]. (2012-09-08 12:32:55)

## 2015-09-04 NOTE — Progress Notes (Signed)
       Patient: Rebecca Greer Female    DOB: [birth year]   55 y.o.   MRN: [id number] Visit Date: 09/04/2015  Today's Provider: Margaretann LovelessJennifer M Burnette, PA-C   Chief Complaint  Patient presents with  . Follow-up    Anxiety   Subjective:    HPI Anxiety: Patient here to follow-up on  anxiety disorder.She was seen 2 weeks ago. She has the following symptoms: none. Onset of symptoms was approximately several years. She denies current suicidal and homicidal ideation. Family history significant for alcoholism.Possible organic causes contributing are: none. Risk factors: work Previous treatment includes Xanax and Sertraline . She complains of the following side effects from the treatment: none. Patient was started on Lexapro10 mg 2 week ago. Xanax at bedtime as needed.     No Known Allergies Current Meds  Medication Sig  . ALPRAZolam (XANAX) 0.5 MG tablet Take 1-2 tablets at bedtime as needed.  Marland Kitchen. escitalopram (LEXAPRO) 10 MG tablet Take 1 tablet (10 mg total) by mouth daily. Take 1/2 tablet for 1 week then 1 tablet daily  . omeprazole (PRILOSEC) 20 MG capsule Take 1 capsule (20 mg total) by mouth daily.  . pravastatin (PRAVACHOL) 20 MG tablet Take 1 tablet (20 mg total) by mouth daily.  Marland Kitchen. triamcinolone (NASACORT ALLERGY 24HR) 55 MCG/ACT AERO nasal inhaler Place into the nose.    Review of Systems  Constitutional: Negative.   Respiratory: Negative.   Cardiovascular: Negative.   Gastrointestinal: Negative.   Psychiatric/Behavioral: Negative.     Social History  Substance Use Topics  . Smoking status: Former Smoker    Quit date: 02/24/1994  . Smokeless tobacco: Never Used  . Alcohol Use: Yes     Comment: Rarely   Objective:   BP 110/78 mmHg  Pulse 86  Temp(Src) 97.8 F (36.6 C) (Oral)  Resp 16  Wt 167 lb (75.751 kg)  LMP 07/27/2015 (Approximate)  Physical Exam  Constitutional: She appears well-developed and well-nourished. No distress.  Neck: Normal range of motion. Neck  supple.  Cardiovascular: Normal rate, regular rhythm and normal heart sounds.  Exam reveals no gallop and no friction rub.   No murmur heard. Pulmonary/Chest: Effort normal and breath sounds normal. No respiratory distress. She has no wheezes. She has no rales.  Skin: She is not diaphoretic.  Psychiatric: She has a normal mood and affect. Her behavior is normal. Judgment and thought content normal.  Vitals reviewed.     Assessment & Plan:     1. Adjustment disorder with anxiety She is doing very well at this time with Lexapro 10mg  and xanax prn for sleep. Will continue current dose of lexapro. I will see her back in 4 weeks to recheck and make sure she is stable with current dose and work stressors. May also discuss weight if needed when she returns in 4 weeks as well.   The entirety of the information documented in the History of Present Illness, Review of Systems and Physical Exam were personally obtained by me. Portions of this information were initially documented by Hetty ElyJoseline Rosas, CMA and reviewed by me for thoroughness and accuracy.      Margaretann LovelessJennifer M Burnette, PA-C  [facility]

## 2015-10-02 ENCOUNTER — Ambulatory Visit: Payer: 59 | Admitting: Physician Assistant

## 2015-10-12 ENCOUNTER — Other Ambulatory Visit: Payer: Self-pay | Admitting: Family Medicine

## 2015-10-12 DIAGNOSIS — R0789 Other chest pain: Secondary | ICD-10-CM

## 2015-10-12 DIAGNOSIS — F4322 Adjustment disorder with anxiety: Secondary | ICD-10-CM

## 2015-10-18 ENCOUNTER — Other Ambulatory Visit: Payer: Self-pay

## 2015-10-18 DIAGNOSIS — F4322 Adjustment disorder with anxiety: Secondary | ICD-10-CM

## 2015-10-18 DIAGNOSIS — E7800 Pure hypercholesterolemia, unspecified: Secondary | ICD-10-CM

## 2015-10-18 DIAGNOSIS — R0789 Other chest pain: Secondary | ICD-10-CM

## 2015-10-18 MED ORDER — OMEPRAZOLE 20 MG PO CPDR: 20.0000 mg | DELAYED_RELEASE_CAPSULE | Freq: Every day | ORAL | 3 refills | Status: DC

## 2015-10-18 MED ORDER — ESCITALOPRAM OXALATE 10 MG PO TABS: 10.0000 mg | ORAL_TABLET | Freq: Every day | ORAL | 3 refills | Status: DC

## 2015-10-18 MED ORDER — ALPRAZOLAM 0.5 MG PO TABS: ORAL_TABLET | ORAL | 1 refills | Status: DC

## 2015-10-18 MED ORDER — PRAVASTATIN SODIUM 20 MG PO TABS: 20.0000 mg | ORAL_TABLET | Freq: Every day | ORAL | 3 refills | Status: DC

## 2015-10-18 NOTE — Telephone Encounter (Signed)
LOV with you 09/04/2015. Allene DillonEmily Drozdowski, CMA

## 2015-10-19 ENCOUNTER — Other Ambulatory Visit: Payer: Self-pay | Admitting: Physician Assistant

## 2015-10-19 DIAGNOSIS — F4322 Adjustment disorder with anxiety: Secondary | ICD-10-CM

## 2015-10-19 MED ORDER — ALPRAZOLAM 0.5 MG PO TABS: ORAL_TABLET | ORAL | 1 refills | Status: DC

## 2015-10-19 NOTE — Progress Notes (Signed)
Please call in alprazolam 0.5mg  1/2 to 1 tab PO q h.s prn #90 1 RF

## 2015-10-19 NOTE — Telephone Encounter (Signed)
Prescription for Alprazolam 0.5 MG tablet [facility] QTY:90 R:1  Directions: Take 1/2-1 tablets at bedtime as needed.  Thanks,  -Gary Gabrielsen

## 2016-02-01 ENCOUNTER — Encounter: Payer: 59 | Admitting: Family Medicine

## 2016-05-09 ENCOUNTER — Encounter: Payer: Self-pay | Admitting: Physician Assistant

## 2016-05-09 ENCOUNTER — Ambulatory Visit (INDEPENDENT_AMBULATORY_CARE_PROVIDER_SITE_OTHER): Payer: 59 | Admitting: Physician Assistant

## 2016-05-09 VITALS — BP 122/80 | HR 88 | Temp 98.5°F | Resp 16 | Wt 184.0 lb

## 2016-05-09 DIAGNOSIS — F325 Major depressive disorder, single episode, in full remission: Secondary | ICD-10-CM

## 2016-05-09 DIAGNOSIS — R0789 Other chest pain: Secondary | ICD-10-CM

## 2016-05-09 DIAGNOSIS — R739 Hyperglycemia, unspecified: Secondary | ICD-10-CM | POA: Diagnosis not present

## 2016-05-09 DIAGNOSIS — E7800 Pure hypercholesterolemia, unspecified: Secondary | ICD-10-CM

## 2016-05-09 DIAGNOSIS — F4322 Adjustment disorder with anxiety: Secondary | ICD-10-CM

## 2016-05-09 MED ORDER — ESCITALOPRAM OXALATE 10 MG PO TABS: 20.0000 mg | ORAL_TABLET | Freq: Every day | ORAL | 0 refills | Status: DC

## 2016-05-09 MED ORDER — OMEPRAZOLE 20 MG PO CPDR: 20.0000 mg | DELAYED_RELEASE_CAPSULE | Freq: Every day | ORAL | 3 refills | Status: DC

## 2016-05-09 NOTE — Patient Instructions (Signed)

## 2016-05-09 NOTE — Progress Notes (Signed)
Patient: Rebecca Greer Female    DOB: [birth year]   56 y.o.   MRN: [id number] Visit Date: 05/09/2016  Today's Provider: Trey Sailors, PA-C   Chief Complaint  Patient presents with  . Anxiety    Worsening in the last several months.   . Hyperlipidemia   Subjective:    Anxiety  Presents for follow-up visit. Symptoms include decreased concentration (Only when anxious), excessive worry, insomnia and nervous/anxious behavior. Patient reports no chest pain, confusion, depressed mood, dizziness, feeling of choking, hyperventilation, malaise, nausea, panic, shortness of breath or suicidal ideas. The quality of sleep is fair (Pt has a hard time falling asleep, but is able to stay asleep. ).    Hyperlipidemia  This is a chronic problem. The problem is controlled. Pertinent negatives include no chest pain or shortness of breath. Current antihyperlipidemic treatment includes statins. There are no compliance problems.    Has been feeling more anxious recently. Stressful work. Husband recently had a heart attack. Would like Lexapro increased. Would also like to order labs before physical exam.   No results found for: CHOL No results found for: HDL No results found for: LDLCALC No results found for: TRIG No results found for: CHOLHDL No results found for: LDLDIRECT  No Known Allergies   Current Outpatient Prescriptions:  .  ALPRAZolam (XANAX) 0.5 MG tablet, Take 1/2-1 tablets at bedtime as needed., Disp: 90 tablet, Rfl: 1 .  escitalopram (LEXAPRO) 10 MG tablet, Take 1 tablet (10 mg total) by mouth daily., Disp: 90 tablet, Rfl: 3 .  omeprazole (PRILOSEC) 20 MG capsule, Take 1 capsule (20 mg total) by mouth daily., Disp: 90 capsule, Rfl: 3 .  pravastatin (PRAVACHOL) 20 MG tablet, Take 1 tablet (20 mg total) by mouth daily., Disp: 90 tablet, Rfl: 3 .  triamcinolone (NASACORT ALLERGY 24HR) 55 MCG/ACT AERO nasal inhaler, Place into the nose., Disp: , Rfl:   Review of Systems    Constitutional: Negative.   Respiratory: Negative.  Negative for shortness of breath.   Cardiovascular: Negative.  Negative for chest pain.  Gastrointestinal: Negative.  Negative for nausea.  Musculoskeletal: Negative.   Neurological: Negative for dizziness, light-headedness and headaches.  Psychiatric/Behavioral: Positive for decreased concentration (Only when anxious) and sleep disturbance (Does take a long time to fall asleep). Negative for agitation, behavioral problems, confusion, dysphoric mood, hallucinations, self-injury and suicidal ideas. The patient is nervous/anxious and has insomnia. The patient is not hyperactive.     Social History  Substance Use Topics  . Smoking status: Former Smoker    Quit date: 02/24/1994  . Smokeless tobacco: Never Used  . Alcohol use Yes     Comment: Rarely   Objective:   BP 122/80 (BP Location: Left Arm, Patient Position: Sitting, Cuff Size: Normal)   Pulse 88   Temp 98.5 F (36.9 C) (Oral)   Resp 16   Wt 184 lb (83.5 kg)   LMP 04/25/2016   BMI 31.58 kg/m  Vitals:   05/09/16 1009  BP: 122/80  Pulse: 88  Resp: 16  Temp: 98.5 F (36.9 C)  TempSrc: Oral  Weight: 184 lb (83.5 kg)   GAD 7 : Generalized Anxiety Score 05/09/2016  Nervous, Anxious, on Edge 3  Control/stop worrying 1  Worry too much - different things 0  Trouble relaxing 0  Restless 0  Easily annoyed or irritable 3  Afraid - awful might happen 0  Total GAD 7 Score 7  Anxiety Difficulty Very difficult  Depression screen [facility] 05/09/2016  Decreased Interest 3  Down, Depressed, Hopeless 1  PHQ - 2 Score 4  Altered sleeping 3  Tired, decreased energy 3  Change in appetite 3  Feeling bad or failure about yourself  0  Trouble concentrating 1  Moving slowly or fidgety/restless 0  Suicidal thoughts 2  PHQ-9 Score 16     Physical Exam  Constitutional: She appears well-developed and well-nourished.  Cardiovascular: Normal rate and regular rhythm.    Pulmonary/Chest: Effort normal and breath sounds normal.  Abdominal: Soft. Bowel sounds are normal.  Skin: Skin is warm and dry.  Psychiatric: She has a normal mood and affect. Her behavior is normal.        Assessment & Plan:     1. Adjustment disorder with anxiety  Increased lexapro to 20 mg daily. Will have patient follow up in one month for physical.  - escitalopram (LEXAPRO) 10 MG tablet; Take 2 tablets (20 mg total) by mouth daily.  Dispense: 180 tablet; Refill: 0  2. Depression, major, single episode, complete remission (HCC)  See above.  - TSH  3. Hypercholesteremia  Labs as below. Currently on pravachol.   - Comprehensive metabolic panel - CBC with Differential/Platelet - Lipid panel  4. Atypical chest pain  - omeprazole (PRILOSEC) 20 MG capsule; Take 1 capsule (20 mg total) by mouth daily.  Dispense: 90 capsule; Refill: 3  5. Blood glucose elevated  - Hemoglobin A1c  The entirety of the information documented in the History of Present Illness, Review of Systems and Physical Exam were personally obtained by me. Portions of this information were initially documented by Kavin LeechLaura Walsh, CMA and reviewed by me for thoroughness and accuracy.   Return in about 1 month (around 06/09/2016) for CPE.          Trey SailorsAdriana M Pollak, PA-C  [facility]

## 2016-05-14 ENCOUNTER — Telehealth: Payer: Self-pay | Admitting: Physician Assistant

## 2016-05-14 DIAGNOSIS — F4322 Adjustment disorder with anxiety: Secondary | ICD-10-CM

## 2016-05-14 NOTE — Telephone Encounter (Signed)
Patient needs refill on her Generic [facility]

## 2016-05-15 ENCOUNTER — Telehealth: Payer: Self-pay

## 2016-05-15 LAB — LIPID PANEL
Chol/HDL Ratio: 4.4 ratio units (ref 0.0–4.4)
Cholesterol, Total: 255 mg/dL — ABNORMAL HIGH (ref 100–199)
HDL: 58 mg/dL (ref >39)
LDL Calculated: 158 mg/dL — ABNORMAL HIGH (ref 0–99)
Triglycerides: 195 mg/dL — ABNORMAL HIGH (ref 0–149)
VLDL Cholesterol Cal: 39 mg/dL (ref 5–40)

## 2016-05-15 LAB — COMPREHENSIVE METABOLIC PANEL
ALT: 31 IU/L (ref 0–32)
AST: 23 IU/L (ref 0–40)
Albumin/Globulin Ratio: 1.3 (ref 1.2–2.2)
Albumin: 4 g/dL (ref 3.5–5.5)
Alkaline Phosphatase: 66 IU/L (ref 39–117)
BUN/Creatinine Ratio: 15 (ref 9–23)
BUN: 12 mg/dL (ref 6–24)
Bilirubin Total: 0.2 mg/dL (ref 0.0–1.2)
CO2: 22 mmol/L (ref 18–29)
Calcium: 9.5 mg/dL (ref 8.7–10.2)
Chloride: 102 mmol/L (ref 96–106)
Creatinine, Ser: 0.78 mg/dL (ref 0.57–1.00)
GFR calc Af Amer: 100 mL/min/{1.73_m2} (ref >59)
GFR calc non Af Amer: 86 mL/min/{1.73_m2} (ref >59)
Globulin, Total: 3 g/dL (ref 1.5–4.5)
Glucose: 102 mg/dL — ABNORMAL HIGH (ref 65–99)
Potassium: 4.4 mmol/L (ref 3.5–5.2)
Sodium: 141 mmol/L (ref 134–144)
Total Protein: 7 g/dL (ref 6.0–8.5)

## 2016-05-15 LAB — CBC WITH DIFFERENTIAL/PLATELET
Basophils Absolute: 0 10*3/uL (ref 0.0–0.2)
Basos: 0 %
EOS (ABSOLUTE): 0.1 10*3/uL (ref 0.0–0.4)
Eos: 2 %
Hematocrit: 37.6 % (ref 34.0–46.6)
Hemoglobin: 12.6 g/dL (ref 11.1–15.9)
Immature Grans (Abs): 0 10*3/uL (ref 0.0–0.1)
Immature Granulocytes: 0 %
Lymphocytes Absolute: 1.6 10*3/uL (ref 0.7–3.1)
Lymphs: 25 %
MCH: 27.7 pg (ref 26.6–33.0)
MCHC: 33.5 g/dL (ref 31.5–35.7)
MCV: 83 fL (ref 79–97)
Monocytes Absolute: 0.4 10*3/uL (ref 0.1–0.9)
Monocytes: 7 %
Neutrophils Absolute: 4.2 10*3/uL (ref 1.4–7.0)
Neutrophils: 66 %
Platelets: 190 10*3/uL (ref 150–379)
RBC: 4.55 x10E6/uL (ref 3.77–5.28)
RDW: 14.4 % (ref 12.3–15.4)
WBC: 6.4 10*3/uL (ref 3.4–10.8)

## 2016-05-15 LAB — TSH: TSH: 2.89 u[IU]/mL (ref 0.450–4.500)

## 2016-05-15 LAB — HEMOGLOBIN A1C
Est. average glucose Bld gHb Est-mCnc: 111 mg/dL
Hgb A1c MFr Bld: 5.5 % (ref 4.8–5.6)

## 2016-05-15 MED ORDER — ALPRAZOLAM 0.5 MG PO TABS: ORAL_TABLET | ORAL | 0 refills | Status: DC

## 2016-05-15 NOTE — Telephone Encounter (Signed)
Yes I edited the script for less with no refills. Possibly will discuss increasing lexapro.

## 2016-05-15 NOTE — Telephone Encounter (Signed)
Is it okay to [facility]?   Thanks,   -Vernona RiegerLaura

## 2016-05-15 NOTE — Telephone Encounter (Signed)
Pt advised.   Thanks,   -Armoni Depass  

## 2016-05-15 NOTE — Telephone Encounter (Signed)
-----   Message from Trey SailorsAdriana M Pollak, [place]PA-C sent at 05/15/2016  9:17 AM EDT ----- Fasting glucose a little high, but A1C is normal. Cholesterol high, but tx not necessary right now, 10 yr cardiac risk is 1.5%. Otherwise, CBC and CMEt normal. TSH normal.

## 2016-06-06 ENCOUNTER — Ambulatory Visit: Payer: 59 | Admitting: Physician Assistant

## 2016-06-27 ENCOUNTER — Other Ambulatory Visit: Payer: Self-pay | Admitting: Physician Assistant

## 2016-06-27 DIAGNOSIS — F4322 Adjustment disorder with anxiety: Secondary | ICD-10-CM

## 2016-06-27 NOTE — Telephone Encounter (Signed)
Needs office visit for follow up before more refills.

## 2016-07-10 ENCOUNTER — Encounter: Payer: Self-pay | Admitting: Physician Assistant

## 2016-07-10 ENCOUNTER — Ambulatory Visit (INDEPENDENT_AMBULATORY_CARE_PROVIDER_SITE_OTHER): Payer: 59 | Admitting: Physician Assistant

## 2016-07-10 VITALS — BP 104/70 | HR 80 | Temp 98.5°F | Resp 16 | Ht 63.5 in | Wt 191.0 lb

## 2016-07-10 DIAGNOSIS — Z1231 Encounter for screening mammogram for malignant neoplasm of breast: Secondary | ICD-10-CM

## 2016-07-10 DIAGNOSIS — F4322 Adjustment disorder with anxiety: Secondary | ICD-10-CM

## 2016-07-10 DIAGNOSIS — Z0000 Encounter for general adult medical examination without abnormal findings: Secondary | ICD-10-CM | POA: Diagnosis not present

## 2016-07-10 DIAGNOSIS — Z114 Encounter for screening for human immunodeficiency virus [HIV]: Secondary | ICD-10-CM | POA: Diagnosis not present

## 2016-07-10 DIAGNOSIS — E7800 Pure hypercholesterolemia, unspecified: Secondary | ICD-10-CM

## 2016-07-10 DIAGNOSIS — Z1159 Encounter for screening for other viral diseases: Secondary | ICD-10-CM | POA: Diagnosis not present

## 2016-07-10 DIAGNOSIS — Z1239 Encounter for other screening for malignant neoplasm of breast: Secondary | ICD-10-CM

## 2016-07-10 MED ORDER — ALPRAZOLAM 0.5 MG PO TABS: ORAL_TABLET | ORAL | 1 refills | Status: DC

## 2016-07-10 MED ORDER — PRAVASTATIN SODIUM 40 MG PO TABS: 40.0000 mg | ORAL_TABLET | Freq: Every day | ORAL | 1 refills | Status: DC

## 2016-07-10 NOTE — Progress Notes (Signed)
Patient: Rebecca Greer, Female    DOB: [birth year], 56 y.o.   MRN: [id number] Visit Date: 07/10/2016  Today's Provider: Trey Sailors, PA-C   Chief Complaint  Patient presents with  . Annual Exam   Subjective:    Annual physical exam Rebecca Greer is a 56 y.o. female who presents today for health maintenance and complete physical. She feels well. She reports exercising occasionally. She reports she is sleeping fairly well.  Pt reports she needs to take 0.5mg  Xanax at night.   She works at [employer]. She has been married to her husband for 26 years. She has three children total. No concerns for STIs.   No family hx of breast ca or colon ca.    Colonoscopy: 2015, normal, repeat in 10 years PAP: 2014 normal with gyn last recorded, sees [facility]: Last recorded 2010 and normal.  Smoke: 1/2 pack per day for 3 years  Alcohol: no alcohol in over a year   Has never been screened for HCV, HIV. Declines Tdap today.   Takes pravastatin for high cholesterol with no adverse effects. She is currently on 20 mg nightly, total cholesterol was 255, LDL 150s. She has family history of early heart disease in her father. Recently gained some weight. Trying to do walking as exercise and modifying diet.  She is taking Lexapro 20 mg daily and 0.5mg  Xanax nightly. Doing better with increased dose of Lexapro for anxiety. Previously has been on sertraline with little success.  -----------------------------------------------------------------   Review of Systems  Social History      She  reports that she quit smoking about 22 years ago. She has never used smokeless tobacco. She reports that she drinks alcohol. She reports that she does not use drugs.       Social History   Social History  . Marital status: Married    Spouse name: N/A  . Number of children: 2  . Years of education: College   Occupational History  . Full time    Social History Main Topics  . Smoking  status: Former Smoker    Quit date: 02/24/1994  . Smokeless tobacco: Never Used  . Alcohol use Yes     Comment: Rarely  . Drug use: No  . Sexual activity: Not Asked   Other Topics Concern  . None   Social History Narrative   Married   Does not get regular exercise    Past Medical History:  Diagnosis Date  . Anxiety   . Hyperlipidemia   . Hypertension      Patient Active Problem List   Diagnosis Date Noted  . Atypical chest pain 07/04/2015  . BP (high blood pressure) 12/12/2014  . Blood glucose elevated 11/16/2014  . SHORTNESS OF BREATH 04/10/2009  . CHEST PAIN-UNSPECIFIED 04/10/2009  . Adjustment disorder with anxiety 04/07/2009  . Hypercholesteremia 12/23/2008  . Allergic rhinitis 11/16/2008  . Cannot sleep 08/18/2008    Past Surgical History:  Procedure Laterality Date  . CHOLECYSTECTOMY      Family History        Family Status  Relation Status  . Father Deceased at age 1  . Mother Alive        Her family history includes Heart disease in her father; Hyperlipidemia in her mother; Hypertension in her mother; Melanoma in her father; Skin cancer in her father.     No Known Allergies   Current Outpatient Prescriptions:  .  ALPRAZolam (XANAX) 0.5  MG tablet, Take 1/2-1 tablets at bedtime as needed., Disp: 60 tablet, Rfl: 0 .  escitalopram (LEXAPRO) 10 MG tablet, TAKE 2 TABLETS BY MOUTH  DAILY, Disp: 60 tablet, Rfl: 1 .  omeprazole (PRILOSEC) 20 MG capsule, Take 1 capsule (20 mg total) by mouth daily., Disp: 90 capsule, Rfl: 3 .  pravastatin (PRAVACHOL) 20 MG tablet, Take 1 tablet (20 mg total) by mouth daily., Disp: 90 tablet, Rfl: 3 .  triamcinolone (NASACORT ALLERGY 24HR) 55 MCG/ACT AERO nasal inhaler, Place into the nose., Disp: , Rfl:    Patient Care Team: Reine JustBurnette, Jennifer M, PA-C as PCP - General (Family Medicine)      Objective:   Vitals: BP 104/70 (BP Location: Left Arm, Patient Position: Sitting, Cuff Size: Large)   Pulse 80   Temp 98.5 F  (36.9 C) (Oral)   Resp 16   Ht 5' 3.5" (1.613 m)   Wt 191 lb (86.6 kg)   LMP 07/10/2016   BMI 33.30 kg/m    Vitals:   07/10/16 0917  BP: 104/70  Pulse: 80  Resp: 16  Temp: 98.5 F (36.9 C)  TempSrc: Oral  Weight: 191 lb (86.6 kg)  Height: 5' 3.5" (1.613 m)     Physical Exam   Depression Screen PHQ 2/9 Scores 05/09/2016  PHQ - 2 Score 4  PHQ- 9 Score 16      Assessment & Plan:     Routine Health Maintenance and Physical Exam  Exercise Activities and Dietary recommendations Goals    None      Immunization History  Administered Date(s) Administered  . Influenza,inj,Quad PF,36+ Mos 01/02/2015    Health Maintenance  Topic Date Due  . Hepatitis C Screening  [birth year]  . HIV Screening  04/09/1977  . MAMMOGRAM  04/09/2012  . PAP SMEAR  10/20/2015  . TETANUS/TDAP  07/10/2017 (Originally 04/09/1981)  . INFLUENZA VACCINE  09/25/2016  . COLONOSCOPY  12/17/2022     Discussed health benefits of physical activity, and encouraged her to engage in regular exercise appropriate for her age and condition.   1. Annual physical exam   2. Breast cancer screening  Ordered. [facility]   - MM Digital Screening; Future  3. Encounter for screening for HIV  - HIV antibody (with reflex)  4. Need for hepatitis C screening test  - Hepatitis C antibody, reflex  5. Adjustment disorder with anxiety  Patient signed controlled substance contract today. Counseled that she is not to use alcohol or narcotic medications while on this. Also talked about yearly urine drug screen.   - ALPRAZolam (XANAX) 0.5 MG tablet; Take 1/2-1 tablets at bedtime as needed.  Dispense: 90 tablet; Refill: 1  6. Hypercholesteremia  Cholesterol was a little high on last lab and with patient's family history of early heart disease, will increase dose. Patient will also work on lifestyle and exercise changes. See her back in 6 mo for this and repeat lipid  panel.  - pravastatin (PRAVACHOL) 40 MG tablet; Take 1 tablet (40 mg total) by mouth daily.  Dispense: 90 tablet; Refill: 1  Return in about 6 months (around 01/10/2017) for cholesterol, anxiety.  The entirety of the information documented in the History of Present Illness, Review of Systems and Physical Exam were personally obtained by me. Portions of this information were initially documented by Kavin LeechLaura Walsh, CMA and reviewed by me for thoroughness and accuracy.   --------------------------------------------------------------------    Rebecca SailorsAdriana M Pollak, PA-C  [facility]  [facility]  

## 2016-07-10 NOTE — Patient Instructions (Addendum)
Please sign a release of information for your gynecologist so we can get your mammogram and PAP smear history.    Health Maintenance, Female Adopting a healthy lifestyle and getting preventive care can go a long way to promote health and wellness. Talk with your health care provider about what schedule of regular examinations is right for you. This is a good chance for you to check in with your provider about disease prevention and staying healthy. In between checkups, there are plenty of things you can do on your own. Experts have done a lot of research about which lifestyle changes and preventive measures are most likely to keep you healthy. Ask your health care provider for more information. Weight and diet Eat a healthy diet  Be sure to include plenty of vegetables, fruits, low-fat dairy products, and lean protein.  Do not eat a lot of foods high in solid fats, added sugars, or salt.  Get regular exercise. This is one of the most important things you can do for your health.  Most adults should exercise for at least 150 minutes each week. The exercise should increase your heart rate and make you sweat (moderate-intensity exercise).  Most adults should also do strengthening exercises at least twice a week. This is in addition to the moderate-intensity exercise. Maintain a healthy weight  Body mass index (BMI) is a measurement that can be used to identify possible weight problems. It estimates body fat based on height and weight. Your health care provider can help determine your BMI and help you achieve or maintain a healthy weight.  For females 41 years of age and older:  A BMI below 18.5 is considered underweight.  A BMI of 18.5 to 24.9 is normal.  A BMI of 25 to 29.9 is considered overweight.  A BMI of 30 and above is considered obese. Watch levels of cholesterol and blood lipids  You should start having your blood tested for lipids and cholesterol at 54 years of age, then have  this test every 5 years.  You may need to have your cholesterol levels checked more often if:  Your lipid or cholesterol levels are high.  You are older than 54 years of age.  You are at high risk for heart disease. Cancer screening Lung Cancer  Lung cancer screening is recommended for adults 49-63 years old who are at high risk for lung cancer because of a history of smoking.  A yearly low-dose CT scan of the lungs is recommended for people who:  Currently smoke.  Have quit within the past 15 years.  Have at least a 30-pack-year history of smoking. A pack year is smoking an average of one pack of cigarettes a day for 1 year.  Yearly screening should continue until it has been 15 years since you quit.  Yearly screening should stop if you develop a health problem that would prevent you from having lung cancer treatment. Breast Cancer  Practice breast self-awareness. This means understanding how your breasts normally appear and feel.  It also means doing regular breast self-exams. Let your health care provider know about any changes, no matter how small.  If you are in your 20s or 30s, you should have a clinical breast exam (CBE) by a health care provider every 1-3 years as part of a regular health exam.  If you are 79 or older, have a CBE every year. Also consider having a breast X-ray (mammogram) every year.  If you have a family history of  breast cancer, talk to your health care provider about genetic screening.  If you are at high risk for breast cancer, talk to your health care provider about having an MRI and a mammogram every year.  Breast cancer gene (BRCA) assessment is recommended for women who have family members with BRCA-related cancers. BRCA-related cancers include:  Breast.  Ovarian.  Tubal.  Peritoneal cancers.  Results of the assessment will determine the need for genetic counseling and BRCA1 and BRCA2 testing. Cervical Cancer  Your health care  provider may recommend that you be screened regularly for cancer of the pelvic organs (ovaries, uterus, and vagina). This screening involves a pelvic examination, including checking for microscopic changes to the surface of your cervix (Pap test). You may be encouraged to have this screening done every 3 years, beginning at age 38.  For women ages 42-65, health care providers may recommend pelvic exams and Pap testing every 3 years, or they may recommend the Pap and pelvic exam, combined with testing for human papilloma virus (HPV), every 5 years. Some types of HPV increase your risk of cervical cancer. Testing for HPV may also be done on women of any age with unclear Pap test results.  Other health care providers may not recommend any screening for nonpregnant women who are considered low risk for pelvic cancer and who do not have symptoms. Ask your health care provider if a screening pelvic exam is right for you.  If you have had past treatment for cervical cancer or a condition that could lead to cancer, you need Pap tests and screening for cancer for at least 20 years after your treatment. If Pap tests have been discontinued, your risk factors (such as having a new sexual partner) need to be reassessed to determine if screening should resume. Some women have medical problems that increase the chance of getting cervical cancer. In these cases, your health care provider may recommend more frequent screening and Pap tests. Colorectal Cancer  This type of cancer can be detected and often prevented.  Routine colorectal cancer screening usually begins at 54 years of age and continues through 54 years of age.  Your health care provider may recommend screening at an earlier age if you have risk factors for colon cancer.  Your health care provider may also recommend using home test kits to check for hidden blood in the stool.  A small camera at the end of a tube can be used to examine your colon directly  (sigmoidoscopy or colonoscopy). This is done to check for the earliest forms of colorectal cancer.  Routine screening usually begins at age 22.  Direct examination of the colon should be repeated every 5-10 years through 54 years of age. However, you may need to be screened more often if early forms of precancerous polyps or small growths are found. Skin Cancer  Check your skin from head to toe regularly.  Tell your health care provider about any new moles or changes in moles, especially if there is a change in a mole's shape or color.  Also tell your health care provider if you have a mole that is larger than the size of a pencil eraser.  Always use sunscreen. Apply sunscreen liberally and repeatedly throughout the day.  Protect yourself by wearing long sleeves, pants, a wide-brimmed hat, and sunglasses whenever you are outside. Heart disease, diabetes, and high blood pressure  High blood pressure causes heart disease and increases the risk of stroke. High blood pressure is more  likely to develop in:  People who have blood pressure in the high end of the normal range (130-139/85-89 mm Hg).  People who are overweight or obese.  People who are African American.  If you are 4-47 years of age, have your blood pressure checked every 3-5 years. If you are 3 years of age or older, have your blood pressure checked every year. You should have your blood pressure measured twice--once when you are at a hospital or clinic, and once when you are not at a hospital or clinic. Record the average of the two measurements. To check your blood pressure when you are not at a hospital or clinic, you can use:  An automated blood pressure machine at a pharmacy.  A home blood pressure monitor.  If you are between 68 years and 37 years old, ask your health care provider if you should take aspirin to prevent strokes.  Have regular diabetes screenings. This involves taking a blood sample to check your  fasting blood sugar level.  If you are at a normal weight and have a low risk for diabetes, have this test once every three years after 54 years of age.  If you are overweight and have a high risk for diabetes, consider being tested at a younger age or more often. Preventing infection Hepatitis B  If you have a higher risk for hepatitis B, you should be screened for this virus. You are considered at high risk for hepatitis B if:  You were born in a country where hepatitis B is common. Ask your health care provider which countries are considered high risk.  Your parents were born in a high-risk country, and you have not been immunized against hepatitis B (hepatitis B vaccine).  You have HIV or AIDS.  You use needles to inject street drugs.  You live with someone who has hepatitis B.  You have had sex with someone who has hepatitis B.  You get hemodialysis treatment.  You take certain medicines for conditions, including cancer, organ transplantation, and autoimmune conditions. Hepatitis C  Blood testing is recommended for:  Everyone born from [birth year] through 1963.  Anyone with known risk factors for hepatitis C. Sexually transmitted infections (STIs)  You should be screened for sexually transmitted infections (STIs) including gonorrhea and chlamydia if:  You are sexually active and are younger than 54 years of age.  You are older than 54 years of age and your health care provider tells you that you are at risk for this type of infection.  Your sexual activity has changed since you were last screened and you are at an increased risk for chlamydia or gonorrhea. Ask your health care provider if you are at risk.  If you do not have HIV, but are at risk, it may be recommended that you take a prescription medicine daily to prevent HIV infection. This is called pre-exposure prophylaxis (PrEP). You are considered at risk if:  You are sexually active and do not regularly use condoms or  know the HIV status of your partner(s).  You take drugs by injection.  You are sexually active with a partner who has HIV. Talk with your health care provider about whether you are at high risk of being infected with HIV. If you choose to begin PrEP, you should first be tested for HIV. You should then be tested every 3 months for as long as you are taking PrEP. Pregnancy  If you are premenopausal and you may become pregnant, ask  your health care provider about preconception counseling.  If you may become pregnant, take 400 to 800 micrograms (mcg) of folic acid every day.  If you want to prevent pregnancy, talk to your health care provider about birth control (contraception). Osteoporosis and menopause  Osteoporosis is a disease in which the bones lose minerals and strength with aging. This can result in serious bone fractures. Your risk for osteoporosis can be identified using a bone density scan.  If you are 19 years of age or older, or if you are at risk for osteoporosis and fractures, ask your health care provider if you should be screened.  Ask your health care provider whether you should take a calcium or vitamin D supplement to lower your risk for osteoporosis.  Menopause may have certain physical symptoms and risks.  Hormone replacement therapy may reduce some of these symptoms and risks. Talk to your health care provider about whether hormone replacement therapy is right for you. Follow these instructions at home:  Schedule regular health, dental, and eye exams.  Stay current with your immunizations.  Do not use any tobacco products including cigarettes, chewing tobacco, or electronic cigarettes.  If you are pregnant, do not drink alcohol.  If you are breastfeeding, limit how much and how often you drink alcohol.  Limit alcohol intake to no more than 1 drink per day for nonpregnant women. One drink equals 12 ounces of beer, 5 ounces of wine, or 1 ounces of hard  liquor.  Do not use street drugs.  Do not share needles.  Ask your health care provider for help if you need support or information about quitting drugs.  Tell your health care provider if you often feel depressed.  Tell your health care provider if you have ever been abused or do not feel safe at home. This information is not intended to replace advice given to you by your health care provider. Make sure you discuss any questions you have with your health care provider. Document Released: 08/27/2010 Document Revised: 07/20/2015 Document Reviewed: 11/15/2014 Elsevier Interactive Patient Education  2017 [employer].

## 2016-07-19 ENCOUNTER — Telehealth: Payer: Self-pay | Admitting: Physician Assistant

## 2016-07-19 ENCOUNTER — Other Ambulatory Visit: Payer: Self-pay | Admitting: Physician Assistant

## 2016-07-19 DIAGNOSIS — G4700 Insomnia, unspecified: Secondary | ICD-10-CM

## 2016-07-19 DIAGNOSIS — F4322 Adjustment disorder with anxiety: Secondary | ICD-10-CM

## 2016-07-19 MED ORDER — ALPRAZOLAM 0.5 MG PO TABS: ORAL_TABLET | ORAL | 0 refills | Status: DC

## 2016-07-19 NOTE — Progress Notes (Unsigned)
I ordered Xanax on 5/16 to be phoned in to [facility] but I don't see it in the [facility] so I don't believe it was. [facility] tablets [facility]. Still only to take 1/2 - 1 tab daily, can somebody phone this in please and thank you.

## 2016-07-19 NOTE — Telephone Encounter (Signed)
Please review-, can we approve this -aa

## 2016-07-19 NOTE — Telephone Encounter (Signed)
Pt states [facility] has not rec'd the Rx ALPRAZolam (XANAX) 0.5 MG tablet.  90 day supply.   [facility] mail order.  CB#[phone]/MW  Pt is also requesting a Rx for ALPRAZolam Prudy Feeler(XANAX) 0.5 MG tablet sent to the local pharmacy/[facility] because she is out.  Pt is requesting this sent today of possible/MW

## 2016-07-19 NOTE — Progress Notes (Signed)
[facility] [address], and advised patient. Will fax RX to [facility],-aa

## 2016-07-23 NOTE — Telephone Encounter (Signed)
Printed and left to fax with [facility]. [facility] and [facility] to [facility].

## 2016-07-30 ENCOUNTER — Telehealth: Payer: Self-pay | Admitting: Physician Assistant

## 2016-07-30 NOTE — Telephone Encounter (Signed)
Can we please call [facility] and have them fax over her most recent PAP results? They sent me results from 1998. Thanks.

## 2016-07-30 NOTE — Telephone Encounter (Signed)
Per Archie Pattenonya at [facility], this is the most recent pap records on patient. Allene DillonEmily Drozdowski, CMA

## 2016-10-09 ENCOUNTER — Other Ambulatory Visit: Payer: Self-pay | Admitting: Physician Assistant

## 2016-10-09 DIAGNOSIS — G4700 Insomnia, unspecified: Secondary | ICD-10-CM

## 2016-10-09 DIAGNOSIS — F4322 Adjustment disorder with anxiety: Secondary | ICD-10-CM

## 2016-10-09 MED ORDER — ALPRAZOLAM 0.5 MG PO TABS: ORAL_TABLET | ORAL | 1 refills | Status: DC

## 2016-10-09 NOTE — Telephone Encounter (Signed)
[facility] faxed a refill request on the following medications:  ALPRAZolam (XANAX) 0.5 MG tablet  [facility] mail order.

## 2016-10-09 NOTE — Telephone Encounter (Signed)
Last RF 5/25 and last OV 5/16

## 2016-10-22 ENCOUNTER — Encounter: Payer: Self-pay | Admitting: Physician Assistant

## 2016-11-13 ENCOUNTER — Encounter: Payer: Self-pay | Admitting: Physician Assistant

## 2016-11-13 ENCOUNTER — Ambulatory Visit (INDEPENDENT_AMBULATORY_CARE_PROVIDER_SITE_OTHER): Payer: 59 | Admitting: Physician Assistant

## 2016-11-13 VITALS — BP 112/62 | HR 80 | Temp 98.1°F | Resp 16 | Wt 192.0 lb

## 2016-11-13 DIAGNOSIS — Z9229 Personal history of other drug therapy: Secondary | ICD-10-CM | POA: Diagnosis not present

## 2016-11-13 DIAGNOSIS — E669 Obesity, unspecified: Secondary | ICD-10-CM

## 2016-11-13 NOTE — Patient Instructions (Signed)
Obesity, Adult Obesity is having too much body fat. If you have a BMI of 30 or more, you are obese. BMI is a number that explains how much body fat you have. Obesity is often caused by taking in (consuming) more calories than your body uses. Obesity can cause serious health problems. Changing your lifestyle can help to treat obesity. Follow these instructions at home: Eating and drinking   Follow advice from your doctor about what to eat and drink. Your doctor may tell you to: ? Cut down on (limit) fast foods, sweets, and processed snack foods. ? Choose low-fat options. For example, choose low-fat milk instead of whole milk. ? Eat 5 or more servings of fruits or vegetables every day. ? Eat at home more often. This gives you more control over what you eat. ? Choose healthy foods when you eat out. ? Learn what a healthy portion size is. A portion size is the amount of a certain food that is healthy for you to eat at one time. This is different for each person. ? Keep low-fat snacks available. ? Avoid sugary drinks. These include soda, fruit juice, iced tea that is sweetened with sugar, and flavored milk. ? Eat a healthy breakfast.  Drink enough water to keep your pee (urine) clear or pale yellow.  Do not go without eating for long periods of time (do not fast).  Do not go on popular or trendy diets (fad diets). Physical Activity  Exercise often, as told by your doctor. Ask your doctor: ? What types of exercise are safe for you. ? How often you should exercise.  Warm up and stretch before being active.  Do slow stretching after being active (cool down).  Rest between times of being active. Lifestyle  Limit how much time you spend in front of your TV, computer, or video game system (be less sedentary).  Find ways to reward yourself that do not involve food.  Limit alcohol intake to no more than 1 drink a day for nonpregnant women and 2 drinks a day for men. One drink equals 12 oz  of beer, 5 oz of wine, or 1 oz of hard liquor. General instructions  Keep a weight loss journal. This can help you keep track of: ? The food that you eat. ? The exercise that you do.  Take over-the-counter and prescription medicines only as told by your doctor.  Take vitamins and supplements only as told by your doctor.  Think about joining a support group. Your doctor may be able to help with this.  Keep all follow-up visits as told by your doctor. This is important. Contact a doctor if:  You cannot meet your weight loss goal after you have changed your diet and lifestyle for 6 weeks. This information is not intended to replace advice given to you by your health care provider. Make sure you discuss any questions you have with your health care provider. Document Released: 05/06/2011 Document Revised: 07/20/2015 Document Reviewed: 11/30/2014 Elsevier Interactive Patient Education  2018 [employer] Inc[employer]  

## 2016-11-13 NOTE — Progress Notes (Signed)
       Patient: Rebecca Greer Female    DOB: 07-24-1960   56 y.o.   MRN: [id number] Visit Date: 11/13/2016  Today's Provider: Trey Sailors, PA-C   Chief Complaint  Patient presents with  . Obesity   Subjective:    HPI   Obesity: Patient complains of obesity. Here to fill out biometric form for [facility].   Obesity History Weight in late teens: 105 lb. Period of greatest weight gain: 60 lb during her 30's Lowest adult weight: 105lb Highest adult weight: 192lb    Current Exercise Habits none  Current Eating Habits Number of regular meals per day: 3 Number of snacking episodes per day: 1 Who shops for food? patient Who prepares food? patient Who eats with patient? patient and husband Binge behavior?: yes - Only occasionally Purge behavior? no Anorexic behavior? no Eating precipitated by stress? yes  Guilt feelings associated with eating? no        No Known Allergies   Current Outpatient Prescriptions:  .  ALPRAZolam (XANAX) 0.5 MG tablet, 1/2 - 1 tab at bedtime PRN, Disp: 90 tablet, Rfl: 1 .  escitalopram (LEXAPRO) 10 MG tablet, TAKE 2 TABLETS BY MOUTH  DAILY, Disp: 60 tablet, Rfl: 1 .  omeprazole (PRILOSEC) 20 MG capsule, Take 1 capsule (20 mg total) by mouth daily., Disp: 90 capsule, Rfl: 3 .  pravastatin (PRAVACHOL) 40 MG tablet, Take 1 tablet (40 mg total) by mouth daily., Disp: 90 tablet, Rfl: 1 .  triamcinolone (NASACORT ALLERGY 24HR) 55 MCG/ACT AERO nasal inhaler, Place into the nose., Disp: , Rfl:   Review of Systems  Constitutional: Negative.   Respiratory: Negative.   Cardiovascular: Negative.   Gastrointestinal: Negative.   Musculoskeletal: Negative.   Neurological: Negative for dizziness, light-headedness and headaches.    Social History  Substance Use Topics  . Smoking status: Former Smoker    Quit date: 02/24/1994  . Smokeless tobacco: Never Used  . Alcohol use Yes     Comment: Rarely   Objective:   BP 112/62 (BP Location: Left  Arm, Patient Position: Sitting, Cuff Size: Large)   Pulse 80   Temp 98.1 F (36.7 C) (Oral)   Resp 16   Wt 192 lb (87.1 kg)   BMI 33.48 kg/m  Vitals:   11/13/16 1008  BP: 112/62  Pulse: 80  Resp: 16  Temp: 98.1 F (36.7 C)  TempSrc: Oral  Weight: 192 lb (87.1 kg)     Physical Exam  Constitutional: She is oriented to person, place, and time. She appears well-developed and well-nourished.  Neurological: She is alert and oriented to person, place, and time.  Skin: Skin is warm and dry.  Psychiatric: She has a normal mood and affect. Her behavior is normal.        Assessment & Plan:     1. Obesity (BMI 30.0-34.9)  Filled out [facility] biometric appeal. Counseled on diet and lifestyle modifications.  2. Received influenza vaccination at work  Return if symptoms worsen or fail to improve.  The entirety of the information documented in the History of Present Illness, Review of Systems and Physical Exam were personally obtained by me. Portions of this information were initially documented by Kavin Leech, CMA and reviewed by me for thoroughness and accuracy.          Trey Sailors, PA-C  [facility]

## 2016-12-22 ENCOUNTER — Other Ambulatory Visit: Payer: Self-pay | Admitting: Physician Assistant

## 2016-12-22 DIAGNOSIS — E7800 Pure hypercholesterolemia, unspecified: Secondary | ICD-10-CM

## 2016-12-26 ENCOUNTER — Ambulatory Visit
Admission: RE | Admit: 2016-12-26 | Discharge: 2016-12-26 | Disposition: A | Discharge status: Home / Self Care | Payer: 59 | Source: Ambulatory Visit | Attending: Physician Assistant | Admitting: Physician Assistant

## 2016-12-26 DIAGNOSIS — Z1239 Encounter for other screening for malignant neoplasm of breast: Secondary | ICD-10-CM

## 2016-12-26 DIAGNOSIS — Z1231 Encounter for screening mammogram for malignant neoplasm of breast: Secondary | ICD-10-CM | POA: Diagnosis present

## 2017-01-10 ENCOUNTER — Encounter: Payer: Self-pay | Admitting: Physician Assistant

## 2017-01-10 ENCOUNTER — Ambulatory Visit (INDEPENDENT_AMBULATORY_CARE_PROVIDER_SITE_OTHER): Payer: 59 | Admitting: Physician Assistant

## 2017-01-10 VITALS — BP 108/64 | HR 64 | Temp 98.3°F | Resp 16 | Wt 196.0 lb

## 2017-01-10 DIAGNOSIS — Z1159 Encounter for screening for other viral diseases: Secondary | ICD-10-CM

## 2017-01-10 DIAGNOSIS — Z114 Encounter for screening for human immunodeficiency virus [HIV]: Secondary | ICD-10-CM

## 2017-01-10 DIAGNOSIS — E7800 Pure hypercholesterolemia, unspecified: Secondary | ICD-10-CM

## 2017-01-10 DIAGNOSIS — F4322 Adjustment disorder with anxiety: Secondary | ICD-10-CM

## 2017-01-10 DIAGNOSIS — Z23 Encounter for immunization: Secondary | ICD-10-CM | POA: Diagnosis not present

## 2017-01-10 NOTE — Addendum Note (Signed)
Addended by: Kavin LeechWALSH, Phu Record E on: 01/10/2017 09:05 AM   Modules accepted: Orders

## 2017-01-10 NOTE — Patient Instructions (Signed)

## 2017-01-10 NOTE — Progress Notes (Signed)
Patient: Rebecca Greer Nicholes Female    DOB: 06-10-1960   56 y.o.   MRN: [id number] Visit Date: 01/10/2017  Today's Provider: Trey SailorsAdriana Greer Nita Whitmire, PA-C   Chief Complaint  Patient presents with  . Hyperlipidemia  . Anxiety   Subjective:    Rebecca Greer Durnil is a 56 y/o woman following up today for HLD and anxiety. Since last visit, we have increased pravastatin from 20 mg to 40 mg. She has been tolerating this well. She is working on her diet and exercise. No chest pain, SOB. Will need to repeat lipid panel today.   She also is following up for anxiety. She is currently taking Lexapro and using 1 mg Xanax at night for sleep. She feels her anxiety is well controlled. No symptoms of panic attack. Sleeping well. Her husband is doing well after surgery.   She is due for flu and tetanus today. Also due for some routine screenings. She is a [employer].   Hyperlipidemia  This is a chronic problem. The problem is uncontrolled. Recent lipid tests were reviewed and are high. Pertinent negatives include no chest pain, focal sensory loss, focal weakness, leg pain, myalgias or shortness of breath. Current antihyperlipidemic treatment includes statins. There are no compliance problems.   Anxiety  Presents for follow-up visit. Symptoms include nervous/anxious behavior. Patient reports no chest pain, compulsions, confusion, decreased concentration, depressed mood, dizziness, dry mouth, excessive worry, feeling of choking, hyperventilation, impotence, insomnia, irritability, malaise, muscle tension, nausea, obsessions, palpitations, panic, restlessness, shortness of breath or suicidal ideas. The quality of sleep is good.        No Known Allergies   Current Outpatient Medications:  .  ALPRAZolam (XANAX) 0.5 MG tablet, 1/2 - 1 tab at bedtime PRN, Disp: 90 tablet, Rfl: 1 .  escitalopram (LEXAPRO) 10 MG tablet, TAKE 2 TABLETS BY MOUTH  DAILY, Disp: 60 tablet, Rfl: 1 .  omeprazole (PRILOSEC) 20 MG  capsule, Take 1 capsule (20 mg total) by mouth daily., Disp: 90 capsule, Rfl: 3 .  pravastatin (PRAVACHOL) 40 MG tablet, TAKE 1 TABLET BY MOUTH  DAILY, Disp: 90 tablet, Rfl: 1 .  triamcinolone (NASACORT ALLERGY 24HR) 55 MCG/ACT AERO nasal inhaler, Place into the nose., Disp: , Rfl:   Review of Systems  Constitutional: Negative.  Negative for irritability.  Respiratory: Negative.  Negative for shortness of breath.   Cardiovascular: Negative.  Negative for chest pain and palpitations.  Gastrointestinal: Negative.  Negative for nausea.  Genitourinary: Negative for impotence.  Musculoskeletal: Negative.  Negative for myalgias.  Neurological: Negative for dizziness, focal weakness, light-headedness and headaches.  Psychiatric/Behavioral: Negative for confusion, decreased concentration, dysphoric mood, hallucinations, self-injury, sleep disturbance and suicidal ideas. The patient is nervous/anxious. The patient does not have insomnia and is not hyperactive.     Social History   Tobacco Use  . Smoking status: Former Smoker    Last attempt to quit: 02/24/1994    Years since quitting: 22.8  . Smokeless tobacco: Never Used  Substance Use Topics  . Alcohol use: Yes    Comment: Rarely   Objective:   BP 108/64 (BP Location: Right Arm, Patient Position: Sitting, Cuff Size: Large)   Pulse 64   Temp 98.3 F (36.8 C) (Oral)   Resp 16   Wt 196 lb (88.9 kg)   LMP 12/12/2016   SpO2 97%   BMI 34.18 kg/Greer  Vitals:   01/10/17 0814  BP: 108/64  Pulse: 64  Resp: 16  Temp:  98.3 F (36.8 C)  TempSrc: Oral  SpO2: 97%  Weight: 196 lb (88.9 kg)     Physical Exam  Constitutional: She is oriented to person, place, and time. She appears well-developed and well-nourished.  Cardiovascular: Normal rate and regular rhythm.  Pulmonary/Chest: Effort normal and breath sounds normal.  Musculoskeletal: She exhibits no edema.  Neurological: She is alert and oriented to person, place, and time.  Skin:  Skin is warm and dry.  Psychiatric: She has a normal mood and affect. Her behavior is normal.        Assessment & Plan:     1. Hypercholesteremia  - Lipid Profile  2. Adjustment disorder with anxiety  Continue current medications.  3. Influenza vaccine needed  Updated today.  4. Need for diphtheria-tetanus-pertussis (Tdap) vaccine   5. Encounter for hepatitis C screening test for low risk patient  - Hepatitis C antibody  6. Encounter for screening for HIV  - HIV antibody (with reflex)  The entirety of the information documented in the History of Present Illness, Review of Systems and Physical Exam were personally obtained by me. Portions of this information were initially documented by Kavin LeechLaura Walsh, CMA and reviewed by me for thoroughness and accuracy.   Return in about 6 months (around 07/10/2017) for CPE with PAP.        Trey SailorsAdriana Greer Dwaine Pringle, PA-C  [facility]

## 2017-01-11 LAB — LIPID PANEL
Chol/HDL Ratio: 3.8 ratio (ref 0.0–4.4)
Cholesterol, Total: 188 mg/dL (ref 100–199)
HDL: 50 mg/dL (ref >39)
LDL Calculated: 104 mg/dL — ABNORMAL HIGH (ref 0–99)
Triglycerides: 170 mg/dL — ABNORMAL HIGH (ref 0–149)
VLDL Cholesterol Cal: 34 mg/dL (ref 5–40)

## 2017-01-11 LAB — HEPATITIS C ANTIBODY: Hep C Virus Ab: 0.1 s/co ratio (ref 0.0–0.9)

## 2017-01-11 LAB — HIV ANTIBODY (ROUTINE TESTING W REFLEX): HIV Screen 4th Generation wRfx: NONREACTIVE

## 2017-01-20 ENCOUNTER — Telehealth: Payer: Self-pay

## 2017-01-20 NOTE — Telephone Encounter (Signed)
-----   Message from Trey SailorsAdriana M Pollak, [place]PA-C sent at 01/13/2017  2:11 PM EST ----- Cholesterol much improved. Total down from 255 to 188. LDL down from 158 to 104. I think we can stay at this dose. Will check in 36mo - 1 year.

## 2017-01-20 NOTE — Telephone Encounter (Signed)
Left message advising pt.   Thanks,   -Elijah Michaelis  

## 2017-04-08 ENCOUNTER — Encounter: Payer: Self-pay | Admitting: Physician Assistant

## 2017-04-08 DIAGNOSIS — F4322 Adjustment disorder with anxiety: Secondary | ICD-10-CM

## 2017-04-08 DIAGNOSIS — G4700 Insomnia, unspecified: Secondary | ICD-10-CM

## 2017-04-09 MED ORDER — ESCITALOPRAM OXALATE 10 MG PO TABS: 20.0000 mg | ORAL_TABLET | Freq: Every day | ORAL | 1 refills | Status: DC

## 2017-04-09 MED ORDER — ALPRAZOLAM 0.5 MG PO TABS: ORAL_TABLET | ORAL | 1 refills | Status: DC

## 2017-04-09 NOTE — Telephone Encounter (Signed)
Patient is asking qty:180 for Escitalopram 10 mg and qty:90 for Alprazolam.  [facility] [address]

## 2017-04-10 NOTE — Telephone Encounter (Signed)
RX called in.   Thanks,   -Strummer Canipe  

## 2017-04-14 ENCOUNTER — Other Ambulatory Visit: Payer: Self-pay

## 2017-04-14 DIAGNOSIS — F4322 Adjustment disorder with anxiety: Secondary | ICD-10-CM

## 2017-04-14 DIAGNOSIS — G4700 Insomnia, unspecified: Secondary | ICD-10-CM

## 2017-04-14 MED ORDER — ALPRAZOLAM 0.5 MG PO TABS: ORAL_TABLET | ORAL | 1 refills | Status: DC

## 2017-04-14 NOTE — Telephone Encounter (Signed)
Xanax was called into [facility], but [facility] to [facility] [address]   Thanks,   -Vernona RiegerLaura

## 2017-05-30 ENCOUNTER — Other Ambulatory Visit: Payer: Self-pay | Admitting: Physician Assistant

## 2017-05-30 DIAGNOSIS — E7800 Pure hypercholesterolemia, unspecified: Secondary | ICD-10-CM

## 2017-06-09 ENCOUNTER — Other Ambulatory Visit: Payer: Self-pay | Admitting: Physician Assistant

## 2017-06-09 DIAGNOSIS — R0789 Other chest pain: Secondary | ICD-10-CM

## 2017-10-20 ENCOUNTER — Other Ambulatory Visit: Payer: Self-pay | Admitting: Physician Assistant

## 2017-10-20 DIAGNOSIS — E7800 Pure hypercholesterolemia, unspecified: Secondary | ICD-10-CM

## 2017-10-23 ENCOUNTER — Other Ambulatory Visit: Payer: Self-pay | Admitting: Physician Assistant

## 2017-10-23 NOTE — Telephone Encounter (Signed)
[facility] faxed a refill request for the following medication. Thanks CC  pravastatin (PRAVACHOL) 40 MG tablet

## 2017-10-28 NOTE — Telephone Encounter (Signed)
Giving 30 days, needs to schedule annual physical with PAP before more refills.

## 2017-10-29 NOTE — Telephone Encounter (Signed)
lmtcb

## 2017-12-02 ENCOUNTER — Encounter: Payer: Self-pay | Admitting: Family Medicine

## 2017-12-02 ENCOUNTER — Ambulatory Visit: Payer: Managed Care, Other (non HMO) | Admitting: Family Medicine

## 2017-12-02 VITALS — BP 122/72 | HR 87 | Temp 98.1°F | Resp 16 | Ht 63.0 in | Wt 188.6 lb

## 2017-12-02 DIAGNOSIS — E669 Obesity, unspecified: Secondary | ICD-10-CM | POA: Diagnosis not present

## 2017-12-02 NOTE — Patient Instructions (Signed)
Try to exercise 30 minutes daily or at least on the weekend.

## 2017-12-02 NOTE — Progress Notes (Signed)
  Subjective:     Patient ID: Rebecca Greer, female   DOB: 26-Feb-1960, 57 y.o.   MRN: [id number] Chief Complaint  Patient presents with  . Obesity    Patient comes in office today with a appeal form for her recent Biometric Screening through [facility]. Patient states that her BMI is high and she is currently enrolled in Weight Watchers to help her with calorie intake. Patient admits that she is not actively exercising due to work schedule.    HPI States she has a heavy work schedule but does try to take the stairs during breaks at work. No formal exercise.   Review of Systems     Objective:   Physical Exam  Constitutional: She appears well-developed and well-nourished. No distress.       Assessment:    1. Obesity (BMI 30.0-34.9)     Plan:    Continue Weight Watcher's and start walking 30 minutes daily.

## 2018-01-05 ENCOUNTER — Other Ambulatory Visit: Payer: Self-pay | Admitting: Physician Assistant

## 2018-01-05 DIAGNOSIS — F4322 Adjustment disorder with anxiety: Secondary | ICD-10-CM

## 2018-01-06 NOTE — Telephone Encounter (Signed)
Please schedule for CPE with PAP, I have not seen > 1 year. No more refills until appt.

## 2018-02-10 ENCOUNTER — Encounter: Payer: Managed Care, Other (non HMO) | Admitting: Physician Assistant

## 2018-03-03 ENCOUNTER — Encounter: Payer: Managed Care, Other (non HMO) | Admitting: Physician Assistant

## 2018-04-09 ENCOUNTER — Other Ambulatory Visit: Payer: Self-pay | Admitting: Physician Assistant

## 2018-04-09 DIAGNOSIS — F4322 Adjustment disorder with anxiety: Secondary | ICD-10-CM

## 2018-04-14 ENCOUNTER — Encounter: Payer: Managed Care, Other (non HMO) | Admitting: Physician Assistant

## 2018-04-14 NOTE — Progress Notes (Deleted)
Patient: Rebecca Greer, Female    DOB: May 20, 1960, 58 y.o.   MRN: [id number] Visit Date: 04/14/2018  Today's Provider: Trey Sailors, PA-C   No chief complaint on file.  Subjective:     Annual physical exam Rebecca Greer is a 58 y.o. female who presents today for health maintenance and complete physical. She feels {DESC; WELL/FAIRLY WELL/POORLY:18703}. She reports exercising ***. She reports she is sleeping {DESC; WELL/FAIRLY WELL/POORLY:18703}. 10/19/12 Pap-normal 12/26/16 Mammogram-BI-RADS 1 12/16/12 Colonoscopy-normal -----------------------------------------------------------------   Review of Systems  Constitutional: Negative.   HENT: Negative.   Eyes: Negative.   Respiratory: Negative.   Cardiovascular: Negative.   Gastrointestinal: Negative.   Endocrine: Negative.   Genitourinary: Negative.   Musculoskeletal: Negative.   Skin: Negative.   Allergic/Immunologic: Negative.   Neurological: Negative.   Hematological: Negative.   Psychiatric/Behavioral: Negative.     Social History      She  reports that she quit smoking about 24 years ago. She has never used smokeless tobacco. She reports current alcohol use. She reports that she does not use drugs.       Social History   Socioeconomic History  . Marital status: Married    Spouse name: Not on file  . Number of children: 2  . Years of education: College  . Highest education level: Not on file  Occupational History  . Occupation: Full time  Social Needs  . Financial resource strain: Not on file  . Food insecurity:    Worry: Not on file    Inability: Not on file  . Transportation needs:    Medical: Not on file    Non-medical: Not on file  Tobacco Use  . Smoking status: Former Smoker    Last attempt to quit: 02/24/1994    Years since quitting: 24.1  . Smokeless tobacco: Never Used  Substance and Sexual Activity  . Alcohol use: Yes    Comment: Rarely  . Drug use: No  . Sexual activity:  Not on file  Lifestyle  . Physical activity:    Days per week: Not on file    Minutes per session: Not on file  . Stress: Not on file  Relationships  . Social connections:    Talks on phone: Not on file    Gets together: Not on file    Attends religious service: Not on file    Active member of club or organization: Not on file    Attends meetings of clubs or organizations: Not on file    Relationship status: Not on file  Other Topics Concern  . Not on file  Social History Narrative   Married   Does not get regular exercise    Past Medical History:  Diagnosis Date  . Anxiety   . Hyperlipidemia   . Hypertension      Patient Active Problem List   Diagnosis Date Noted  . Atypical chest pain 07/04/2015  . BP (high blood pressure) 12/12/2014  . Blood glucose elevated 11/16/2014  . SHORTNESS OF BREATH 04/10/2009  . CHEST PAIN-UNSPECIFIED 04/10/2009  . Adjustment disorder with anxiety 04/07/2009  . Hypercholesteremia 12/23/2008  . Allergic rhinitis 11/16/2008  . Cannot sleep 08/18/2008    Past Surgical History:  Procedure Laterality Date  . CHOLECYSTECTOMY      Family History        Family Status  Relation Name Status  . Father  Deceased at age 53  . Mother  Alive  Her family history includes Heart disease in her father; Hyperlipidemia in her mother; Hypertension in her mother; Melanoma in her father; Skin cancer in her father.      No Known Allergies   Current Outpatient Medications:  .  escitalopram (LEXAPRO) 10 MG tablet, TAKE 2 TABLETS(20 MG) BY MOUTH DAILY, Disp: 60 tablet, Rfl: 0 .  pravastatin (PRAVACHOL) 40 MG tablet, Take 1 tablet (40 mg total) by mouth daily., Disp: 30 tablet, Rfl: 0 .  triamcinolone (NASACORT ALLERGY 24HR) 55 MCG/ACT AERO nasal inhaler, Place into the nose., Disp: , Rfl:    Patient Care Team: Maryella Shivers as PCP - General (Physician Assistant)    Objective:    Vitals: There were no vitals taken for this  visit.  There were no vitals filed for this visit.   Physical Exam   Depression Screen PHQ 2/9 Scores 05/09/2016  PHQ - 2 Score 4  PHQ- 9 Score 16       Assessment & Plan:     Routine Health Maintenance and Physical Exam  Exercise Activities and Dietary recommendations Goals   None     Immunization History  Administered Date(s) Administered  . Influenza,inj,Quad PF,6+ Mos 01/02/2015, 01/10/2017  . Tdap 01/10/2017    Health Maintenance  Topic Date Due  . INFLUENZA VACCINE  09/25/2017  . PAP SMEAR-Modifier  10/19/2017  . MAMMOGRAM  12/27/2018  . COLONOSCOPY  12/17/2022  . TETANUS/TDAP  01/11/2027  . Hepatitis C Screening  Completed  . HIV Screening  Completed     Discussed health benefits of physical activity, and encouraged her to engage in regular exercise appropriate for her age and condition.    --------------------------------------------------------------------    Trey Sailors, PA-C  [facility]

## 2018-04-16 ENCOUNTER — Other Ambulatory Visit: Payer: Self-pay | Admitting: Physician Assistant

## 2018-04-17 NOTE — Telephone Encounter (Signed)
I have denied omeprazole because I have not seen patient in >1 year. She needs a physical/follow up. Please schedule.

## 2018-04-21 NOTE — Telephone Encounter (Signed)
lmtcb

## 2018-04-24 NOTE — Telephone Encounter (Signed)
lmtcb

## 2018-05-02 ENCOUNTER — Other Ambulatory Visit: Payer: Self-pay | Admitting: Physician Assistant

## 2018-05-04 NOTE — Telephone Encounter (Signed)
LVMTRC 

## 2018-10-26 ENCOUNTER — Other Ambulatory Visit: Payer: Self-pay | Admitting: Physician Assistant

## 2018-10-26 DIAGNOSIS — Z1231 Encounter for screening mammogram for malignant neoplasm of breast: Secondary | ICD-10-CM

## 2018-11-03 NOTE — Progress Notes (Signed)
Patient: Rebecca Greer Female    DOB: 1960/09/19   58 y.o.   MRN: [id number] Visit Date: 11/04/2018  Today's Provider: Trey SailorsAdriana M Pax Reasoner, PA-C   Chief Complaint  Patient presents with  . Hyperlipidemia  . Anxiety   Subjective:     Depression        The problem has been gradually worsening since onset.  Associated symptoms include fatigue and decreased interest.  Associated symptoms include no decreased concentration, no appetite change and no suicidal ideas.  Past treatments include SSRIs - Selective serotonin reuptake inhibitors.  Compliance with treatment is good.   Started working at home since [employer] - describes not feeling good mentally of physically. Wants to lay around and eat all the time. Has been happening since January. Sleeps OK once she gets to sleep but unable to fall asleep quickly. Currently taking Lexapro 20 mg daily and is taking this daily. Reports lack of motivation.      Lipid/Cholesterol, Follow-up:   Last seen for this 01/10/2017.  Management since that visit includes; lab checked, no changes.  Last Lipid Panel:    Component Value Date/Time   CHOL 188 01/10/2017 0847   TRIG 170 (H) 01/10/2017 0847   HDL 50 01/10/2017 0847   CHOLHDL 3.8 01/10/2017 0847   LDLCALC 104 (H) 01/10/2017 0847    She reports fair compliance with treatment.  Pt reports she take it "when I remember it." She is not having side effects.   Wt Readings from Last 3 Encounters:  11/04/18 209 lb (94.8 kg)  12/02/17 188 lb 9.6 oz (85.5 kg)  01/10/17 196 lb (88.9 kg)   BP Readings from Last 3 Encounters:  11/04/18 (!) 134/91  12/02/17 122/72  01/10/17 108/64      [facility]  PHQ-9 Total Score  8     Also reports left sided neck mass that has been present for two weeks. Does not hurt, does not get bigger. She denies fevers, chills, night sweats, history of heavy smoking. She does not have pain when she eats or a foul taste  in her mouth. She does not have a history of HIV.  ------------------------------------------------------------------------      No Known Allergies   Current Outpatient Medications:  .  pravastatin (PRAVACHOL) 40 MG tablet, Take 1 tablet (40 mg total) by mouth daily., Disp: 30 tablet, Rfl: 0 .  escitalopram (LEXAPRO) 10 MG tablet, TAKE 2 TABLETS(20 MG) BY MOUTH DAILY, Disp: 60 tablet, Rfl: 0 .  triamcinolone (NASACORT ALLERGY 24HR) 55 MCG/ACT AERO nasal inhaler, Place into the nose., Disp: , Rfl:   Review of Systems  Constitutional: Positive for fatigue. Negative for appetite change, chills and fever.  Respiratory: Negative for chest tightness and shortness of breath.   Cardiovascular: Negative for chest pain and palpitations.  Gastrointestinal: Negative for abdominal pain, nausea and vomiting.  Neurological: Negative for dizziness and weakness.  Psychiatric/Behavioral: Positive for depression, dysphoric mood and sleep disturbance. Negative for confusion, decreased concentration, hallucinations, self-injury and suicidal ideas. The patient is not nervous/anxious.     Social History   Tobacco Use  . Smoking status: Former Smoker    Quit date: 02/24/1994    Years since quitting: 24.7  . Smokeless tobacco: Never Used  Substance Use Topics  . Alcohol use: Yes    Comment: Rarely      Objective:   BP (!) 134/91 (BP Location: Left Arm, Patient Position: Sitting, Cuff Size:  Large)   Pulse 88   Temp (!) 97.1 F (36.2 C) (Temporal)   Wt 209 lb (94.8 kg)   BMI 37.02 kg/m  Vitals:   11/04/18 0828  BP: (!) 134/91  Pulse: 88  Temp: (!) 97.1 F (36.2 C)  TempSrc: Temporal  Weight: 209 lb (94.8 kg)  Body mass index is 37.02 kg/m.   Physical Exam Constitutional:      Appearance: Normal appearance.  HENT:     Right Ear: Tympanic membrane and ear canal normal.     Left Ear: Tympanic membrane and ear canal normal.     Mouth/Throat:     Mouth: Mucous membranes are moist.       Pharynx: Oropharynx is clear.  Neck:   Cardiovascular:     Rate and Rhythm: Normal rate and regular rhythm.     Heart sounds: Normal heart sounds.  Pulmonary:     Effort: Pulmonary effort is normal.     Breath sounds: Normal breath sounds.  Skin:    General: Skin is warm and dry.  Neurological:     Mental Status: She is alert and oriented to person, place, and time. Mental status is at baseline.  Psychiatric:        Mood and Affect: Mood normal.        Behavior: Behavior normal.      No results found for any visits on 11/04/18.     Assessment & Plan    1. Hypercholesteremia  Check as below, continue statin. Adjust pending lab results.   - Lipid Profile  2. Essential hypertension  - Comprehensive Metabolic Panel (CMET)  3. Insomnia, unspecified type   4. Neck mass  Lymphadenopathy vs. Salivary gland. Suggest observation for two more weeks and imaging vs ENT referral if not resolving. Labs as below.   - CBC with Differential - HIV antibody (with reflex)  5. Depression, unspecified depression type  Add buspar to Lexapro. Follow up in 4 weeks and she will need PAP as well.  - TSH - escitalopram (LEXAPRO) 20 MG tablet; Take 1 tablet (20 mg total) by mouth daily.  Dispense: 90 tablet; Refill: 1 - busPIRone (BUSPAR) 7.5 MG tablet; Take 1 tablet (7.5 mg total) by mouth 2 (two) times daily.  Dispense: 180 tablet; Refill: 0  6. Depression, major, single episode, complete remission ([place])  The entirety of the information documented in the History of Present Illness, Review of Systems and Physical Exam were personally obtained by me. Portions of this information were initially documented by Ashley Royalty, CMA and reviewed by me for thoroughness and accuracy.       Trinna Post, PA-C  [facility]

## 2018-11-04 ENCOUNTER — Other Ambulatory Visit: Payer: Self-pay

## 2018-11-04 ENCOUNTER — Ambulatory Visit: Payer: Managed Care, Other (non HMO) | Admitting: Physician Assistant

## 2018-11-04 ENCOUNTER — Other Ambulatory Visit: Payer: Self-pay | Admitting: Physician Assistant

## 2018-11-04 ENCOUNTER — Encounter: Payer: Self-pay | Admitting: Physician Assistant

## 2018-11-04 VITALS — BP 134/91 | HR 88 | Temp 97.1°F | Wt 209.0 lb

## 2018-11-04 DIAGNOSIS — F329 Major depressive disorder, single episode, unspecified: Secondary | ICD-10-CM

## 2018-11-04 DIAGNOSIS — E7800 Pure hypercholesterolemia, unspecified: Secondary | ICD-10-CM | POA: Diagnosis not present

## 2018-11-04 DIAGNOSIS — I10 Essential (primary) hypertension: Secondary | ICD-10-CM | POA: Diagnosis not present

## 2018-11-04 DIAGNOSIS — G4700 Insomnia, unspecified: Secondary | ICD-10-CM

## 2018-11-04 DIAGNOSIS — F325 Major depressive disorder, single episode, in full remission: Secondary | ICD-10-CM | POA: Insufficient documentation

## 2018-11-04 DIAGNOSIS — R221 Localized swelling, mass and lump, neck: Secondary | ICD-10-CM | POA: Diagnosis not present

## 2018-11-04 DIAGNOSIS — F32A Depression, unspecified: Secondary | ICD-10-CM

## 2018-11-04 MED ORDER — ESCITALOPRAM OXALATE 20 MG PO TABS: 20.0000 mg | ORAL_TABLET | Freq: Every day | ORAL | 1 refills | Status: DC

## 2018-11-04 MED ORDER — BUSPIRONE HCL 7.5 MG PO TABS: 7.5000 mg | ORAL_TABLET | Freq: Two times a day (BID) | ORAL | 0 refills | Status: AC

## 2018-11-04 MED ORDER — PRAVASTATIN SODIUM 40 MG PO TABS: 40.0000 mg | ORAL_TABLET | Freq: Every day | ORAL | 1 refills | Status: DC

## 2018-11-04 NOTE — Patient Instructions (Signed)
Depression Screening Depression screening is a tool that your health care provider can use to learn if you have symptoms of depression. Depression is a common condition with many symptoms that are also often found in other conditions. Depression is treatable, but it must first be diagnosed. You may not know that certain feelings, thoughts, and behaviors that you are having can be symptoms of depression. Taking a depression screening test can help you and your health care provider decide if you need more assessment, or if you should be referred to a mental health care provider. What are the screening tests?  You may have a physical exam to see if another condition is affecting your mental health. You may have a blood or urine sample taken during the physical exam.  You may be interviewed using a screening tool that was developed from research, such as one of these: ? Patient Health Questionnaire (PHQ). This is a set of either 2 or 9 questions. A health care provider who has been trained to score this screening test uses a guide to assess if your symptoms suggest that you may have depression. ? Hamilton Depression Rating Scale (HAM-D). This is a set of either 17 or 24 questions. You may be asked to take it again during or after your treatment, to see if your depression has gotten better. ? Beck Depression Inventory (BDI). This is a set of 21 multiple choice questions. Your health care provider scores your answers to assess:  Your level of depression, ranging from mild to severe.  Your response to treatment.  Your health care provider may talk with you about your daily activities, such as eating, sleeping, work, and recreation, and ask if you have had any changes in activity.  Your health care provider may ask you to see a mental health specialist, such as a psychiatrist or psychologist, for more evaluation. Who should be screened for depression?   All adults, including adults with a family history  of a mental health disorder.  Adolescents who are 12-18 years old.  People who are recovering from a myocardial infarction (MI).  Pregnant women, or women who have given birth.  People who have a long-term (chronic) illness.  Anyone who has been diagnosed with another type of a mental health disorder.  Anyone who has symptoms that could show depression. What do my results mean? Your health care provider will review the results of your depression screening, physical exam, and lab tests. Positive screens suggest that you may have depression. Screening is the first step in getting the care that you may need. It is up to you to get your screening results. Ask your health care provider, or the department that is doing your screening tests, when your results will be ready. Talk with your health care provider about your results and diagnosis. A diagnosis of depression is made using the Diagnostic and Statistical Manual of Mental Disorders (DSM-V). This is a book that lists the number and type of symptoms that must be present for a health care provider to give a specific diagnosis.  Your health care provider may work with you to treat your symptoms of depression, or your health care provider may help you find a mental health provider who can assess, diagnose, and treat your depression. Get help right away if:  You have thoughts about hurting yourself or others. If you ever feel like you may hurt yourself or others, or have thoughts about taking your own life, get help right away. You   can go to your nearest emergency department or call:  Your local emergency services (911 in the [place]  A suicide crisis helpline, such as the National Suicide Prevention Lifeline at 1-800-273-8255. This is open 24 hours a day. Summary  Depression screening is the first step in getting the help that you may need.  If your screening test shows symptoms of depression (is positive), your health care provider may ask  you to see a mental health provider.  Anyone who is age 12 or older should be screened for depression. This information is not intended to replace advice given to you by your health care provider. Make sure you discuss any questions you have with your health care provider. Document Released: 06/28/2016 Document Revised: 01/24/2017 Document Reviewed: 06/28/2016 Elsevier Patient Education  2020 Elsevier Inc.  

## 2018-11-05 ENCOUNTER — Ambulatory Visit: Payer: Managed Care, Other (non HMO) | Admitting: Physician Assistant

## 2018-11-05 LAB — LIPID PANEL
Chol/HDL Ratio: 6 ratio — ABNORMAL HIGH (ref 0.0–4.4)
Cholesterol, Total: 275 mg/dL — ABNORMAL HIGH (ref 100–199)
HDL: 46 mg/dL (ref >39)
LDL Chol Calc (NIH): 184 mg/dL — ABNORMAL HIGH (ref 0–99)
Triglycerides: 237 mg/dL — ABNORMAL HIGH (ref 0–149)
VLDL Cholesterol Cal: 45 mg/dL — ABNORMAL HIGH (ref 5–40)

## 2018-11-05 LAB — CBC WITH DIFFERENTIAL/PLATELET
Basophils Absolute: 0 10*3/uL (ref 0.0–0.2)
Basos: 1 %
EOS (ABSOLUTE): 0.1 10*3/uL (ref 0.0–0.4)
Eos: 2 %
Hematocrit: 44.1 % (ref 34.0–46.6)
Hemoglobin: 14.4 g/dL (ref 11.1–15.9)
Immature Grans (Abs): 0 10*3/uL (ref 0.0–0.1)
Immature Granulocytes: 1 %
Lymphocytes Absolute: 1.2 10*3/uL (ref 0.7–3.1)
Lymphs: 23 %
MCH: 29.3 pg (ref 26.6–33.0)
MCHC: 32.7 g/dL (ref 31.5–35.7)
MCV: 90 fL (ref 79–97)
Monocytes Absolute: 0.4 10*3/uL (ref 0.1–0.9)
Monocytes: 8 %
Neutrophils Absolute: 3.4 10*3/uL (ref 1.4–7.0)
Neutrophils: 65 %
Platelets: 166 10*3/uL (ref 150–450)
RBC: 4.91 x10E6/uL (ref 3.77–5.28)
RDW: 13.7 % (ref 11.7–15.4)
WBC: 5.2 10*3/uL (ref 3.4–10.8)

## 2018-11-05 LAB — COMPREHENSIVE METABOLIC PANEL
ALT: 42 IU/L — ABNORMAL HIGH (ref 0–32)
AST: 24 IU/L (ref 0–40)
Albumin/Globulin Ratio: 1.5 (ref 1.2–2.2)
Albumin: 4.1 g/dL (ref 3.8–4.9)
Alkaline Phosphatase: 61 IU/L (ref 39–117)
BUN/Creatinine Ratio: 19 (ref 9–23)
BUN: 14 mg/dL (ref 6–24)
Bilirubin Total: 0.2 mg/dL (ref 0.0–1.2)
CO2: 21 mmol/L (ref 20–29)
Calcium: 9.1 mg/dL (ref 8.7–10.2)
Chloride: 103 mmol/L (ref 96–106)
Creatinine, Ser: 0.72 mg/dL (ref 0.57–1.00)
GFR calc Af Amer: 108 mL/min/{1.73_m2} (ref >59)
GFR calc non Af Amer: 94 mL/min/{1.73_m2} (ref >59)
Globulin, Total: 2.8 g/dL (ref 1.5–4.5)
Glucose: 108 mg/dL — ABNORMAL HIGH (ref 65–99)
Potassium: 4.4 mmol/L (ref 3.5–5.2)
Sodium: 140 mmol/L (ref 134–144)
Total Protein: 6.9 g/dL (ref 6.0–8.5)

## 2018-11-05 LAB — HIV ANTIBODY (ROUTINE TESTING W REFLEX): HIV Screen 4th Generation wRfx: NONREACTIVE

## 2018-11-05 LAB — TSH: TSH: 2.74 u[IU]/mL (ref 0.450–4.500)

## 2018-11-06 ENCOUNTER — Telehealth: Payer: Self-pay

## 2018-11-06 NOTE — Telephone Encounter (Signed)
lmtcb

## 2018-11-06 NOTE — Telephone Encounter (Signed)
-----   Message from Trinna Post, [place] sent at 11/06/2018 12:23 PM EDT ----- A1c shows prediabetes. Please focus on reducing sugar intake like sweetened drinks and desserts, pasta, potatoes, rice and increasing exercise.

## 2018-11-09 NOTE — Telephone Encounter (Signed)
LMTCB 11/09/2018   Thanks,   -Laura  

## 2018-11-10 NOTE — Telephone Encounter (Signed)
lmtcb

## 2018-11-11 NOTE — Telephone Encounter (Signed)
LMTCB

## 2018-11-12 NOTE — Telephone Encounter (Signed)
Left detailed message on voicemail as per DPR. 

## 2018-11-26 ENCOUNTER — Ambulatory Visit
Admission: RE | Admit: 2018-11-26 | Discharge: 2018-11-26 | Disposition: A | Discharge status: Home / Self Care | Payer: Managed Care, Other (non HMO) | Source: Ambulatory Visit | Attending: Physician Assistant | Admitting: Physician Assistant

## 2018-11-26 DIAGNOSIS — Z1231 Encounter for screening mammogram for malignant neoplasm of breast: Secondary | ICD-10-CM | POA: Insufficient documentation

## 2018-11-27 LAB — SPECIMEN STATUS REPORT

## 2018-11-27 LAB — HGB A1C W/O EAG: Hgb A1c MFr Bld: 5.9 % — ABNORMAL HIGH (ref 4.8–5.6)

## 2018-12-02 ENCOUNTER — Ambulatory Visit: Payer: Self-pay | Admitting: Physician Assistant

## 2018-12-29 ENCOUNTER — Ambulatory Visit: Payer: Self-pay | Admitting: Physician Assistant

## 2019-01-07 ENCOUNTER — Telehealth: Payer: Self-pay | Admitting: Physician Assistant

## 2019-01-07 ENCOUNTER — Ambulatory Visit: Payer: Self-pay | Admitting: Physician Assistant

## 2019-11-28 ENCOUNTER — Emergency Department
Admission: EM | Admit: 2019-11-28 | Discharge: 2019-11-28 | Disposition: A | Discharge status: Home / Self Care | Payer: Managed Care, Other (non HMO) | Attending: Emergency Medicine | Admitting: Emergency Medicine

## 2019-11-28 ENCOUNTER — Other Ambulatory Visit: Payer: Self-pay

## 2019-11-28 ENCOUNTER — Emergency Department: Payer: Managed Care, Other (non HMO)

## 2019-11-28 ENCOUNTER — Encounter: Payer: Self-pay | Admitting: Emergency Medicine

## 2019-11-28 DIAGNOSIS — I10 Essential (primary) hypertension: Secondary | ICD-10-CM | POA: Diagnosis not present

## 2019-11-28 DIAGNOSIS — Z87891 Personal history of nicotine dependence: Secondary | ICD-10-CM | POA: Insufficient documentation

## 2019-11-28 DIAGNOSIS — R109 Unspecified abdominal pain: Secondary | ICD-10-CM | POA: Diagnosis present

## 2019-11-28 DIAGNOSIS — N200 Calculus of kidney: Secondary | ICD-10-CM

## 2019-11-28 DIAGNOSIS — N202 Calculus of kidney with calculus of ureter: Secondary | ICD-10-CM | POA: Diagnosis not present

## 2019-11-28 LAB — CBC
HCT: 42.7 % (ref 36.0–46.0)
Hemoglobin: 15.1 g/dL — ABNORMAL HIGH (ref 12.0–15.0)
MCH: 30.4 pg (ref 26.0–34.0)
MCHC: 35.4 g/dL (ref 30.0–36.0)
MCV: 86.1 fL (ref 80.0–100.0)
Platelets: 182 10*3/uL (ref 150–400)
RBC: 4.96 MIL/uL (ref 3.87–5.11)
RDW: 13.1 % (ref 11.5–15.5)
WBC: 6.8 10*3/uL (ref 4.0–10.5)
nRBC: 0 % (ref 0.0–0.2)

## 2019-11-28 LAB — COMPREHENSIVE METABOLIC PANEL
ALT: 47 U/L — ABNORMAL HIGH (ref 0–44)
AST: 29 U/L (ref 15–41)
Albumin: 4.4 g/dL (ref 3.5–5.0)
Alkaline Phosphatase: 63 U/L (ref 38–126)
Anion gap: 11 (ref 5–15)
BUN: 14 mg/dL (ref 6–20)
CO2: 23 mmol/L (ref 22–32)
Calcium: 9.3 mg/dL (ref 8.9–10.3)
Chloride: 105 mmol/L (ref 98–111)
Creatinine, Ser: 0.76 mg/dL (ref 0.44–1.00)
GFR calc Af Amer: >60 mL/min (ref >60)
GFR calc non Af Amer: >60 mL/min (ref >60)
Glucose, Bld: 158 mg/dL — ABNORMAL HIGH (ref 70–99)
Potassium: 4.2 mmol/L (ref 3.5–5.1)
Sodium: 139 mmol/L (ref 135–145)
Total Bilirubin: 0.6 mg/dL (ref 0.3–1.2)
Total Protein: 7.7 g/dL (ref 6.5–8.1)

## 2019-11-28 LAB — LIPASE, BLOOD: Lipase: 34 U/L (ref 11–51)

## 2019-11-28 MED ORDER — ONDANSETRON 4 MG PO TBDP: 4.0000 mg | ORAL_TABLET | Freq: Once | ORAL | Status: DC | PRN

## 2019-11-28 MED ORDER — IOHEXOL 300 MG/ML  SOLN
100.0000 mL | Freq: Once | INTRAMUSCULAR | Status: AC | PRN
  Administered 2019-11-28: 100 mL via INTRAVENOUS

## 2019-11-28 MED ORDER — ONDANSETRON HCL 4 MG/2ML IJ SOLN
4.0000 mg | Freq: Once | INTRAMUSCULAR | Status: AC | PRN
  Administered 2019-11-28: 4 mg via INTRAVENOUS
  Filled 2019-11-28: qty 2

## 2019-11-28 NOTE — ED Provider Notes (Signed)
[facility] Emergency Department Provider Note   ____________________________________________   First MD Initiated Contact with Patient 11/28/19 1326     (approximate)  I have reviewed the triage vital signs and the nursing notes.   HISTORY  Chief Complaint Emesis and Abdominal Pain    HPI Rebecca Greer is a 59 y.o. female with a stated past medical history of anxiety, cholecystectomy, and hypertension who presents for right-sided abdominal pain that began approximately 24 hours prior to arrival and is described as 9/10, sharp, nonradiating right upper and lower quadrant pain that is mostly resolved at the time of this interview.  Patient endorses associated nausea/vomiting.         Past Medical History:  Diagnosis Date  . Anxiety   . Hyperlipidemia   . Hypertension     Patient Active Problem List   Diagnosis Date Noted  . Depression, major, single episode, complete remission (HCC) 11/04/2018  . Atypical chest pain 07/04/2015  . BP (high blood pressure) 12/12/2014  . Blood glucose elevated 11/16/2014  . SHORTNESS OF BREATH 04/10/2009  . CHEST PAIN-UNSPECIFIED 04/10/2009  . Adjustment disorder with anxiety 04/07/2009  . Hypercholesteremia 12/23/2008  . Allergic rhinitis 11/16/2008  . Cannot sleep 08/18/2008    Past Surgical History:  Procedure Laterality Date  . CHOLECYSTECTOMY      Prior to Admission medications   Medication Sig Start Date End Date Taking? Authorizing Provider  escitalopram (LEXAPRO) 20 MG tablet Take 1 tablet (20 mg total) by mouth daily. 11/04/18 05/03/19  Trey Sailors, PA-C  pravastatin (PRAVACHOL) 40 MG tablet Take 1 tablet (40 mg total) by mouth daily. 11/04/18 05/03/19  Trey Sailors, PA-C    Allergies Patient has no known allergies.  Family History  Problem Relation Age of Onset  . Heart disease Father   . Skin cancer Father   . Melanoma Father   . Hypertension Mother   . Hyperlipidemia Mother      Social History Social History   Tobacco Use  . Smoking status: Former Smoker    Quit date: 02/24/1994    Years since quitting: 25.7  . Smokeless tobacco: Never Used  Vaping Use  . Vaping Use: Never used  Substance Use Topics  . Alcohol use: Yes    Comment: Rarely  . Drug use: No    Review of Systems Constitutional: No fever/chills Eyes: No visual changes. ENT: No sore throat. Cardiovascular: Denies chest pain. Respiratory: Denies shortness of breath. Gastrointestinal: Endorses abdominal pain.  Endorses nausea, no vomiting.  No diarrhea. Genitourinary: Negative for dysuria. Musculoskeletal: Negative for acute arthralgias Skin: Negative for rash. Neurological: Negative for headaches, weakness/numbness/paresthesias in any extremity Psychiatric: Negative for suicidal ideation/homicidal ideation   ____________________________________________   PHYSICAL EXAM:  VITAL SIGNS: ED Triage Vitals  Enc Vitals Group     BP 11/28/19 0937 (!) 144/97     Pulse Rate 11/28/19 0937 92     Resp 11/28/19 0937 18     Temp 11/28/19 0937 98.2 F (36.8 C)     Temp Source 11/28/19 0937 Oral     SpO2 11/28/19 0937 99 %     Weight 11/28/19 0938 200 lb (90.7 kg)     Height 11/28/19 0938 5\' 3"  (1.6 m)     Head Circumference --      Peak Flow --      Pain Score 11/28/19 0938 9     Pain Loc --      Pain Edu? --  Excl. in GC? --    Constitutional: Alert and oriented. Well appearing and in no acute distress. Eyes: Conjunctivae are normal. PERRL. Head: Atraumatic. Nose: No congestion/rhinnorhea. Mouth/Throat: Mucous membranes are moist. Neck: No stridor Cardiovascular: Grossly normal heart sounds.  Good peripheral circulation. Respiratory: Normal respiratory effort.  No retractions. Gastrointestinal: Soft and nontender. No distention. Musculoskeletal: No obvious deformities Neurologic:  Normal speech and language. No gross focal neurologic deficits are appreciated. Skin:  Skin  is warm and dry. No rash noted. Psychiatric: Mood and affect are normal. Speech and behavior are normal.  ____________________________________________   LABS (all labs ordered are listed, but only abnormal results are displayed)  Labs Reviewed  COMPREHENSIVE METABOLIC PANEL - Abnormal; Notable for the following components:      Result Value   Glucose, Bld 158 (*)    ALT 47 (*)    All other components within normal limits  CBC - Abnormal; Notable for the following components:   Hemoglobin 15.1 (*)    All other components within normal limits  LIPASE, BLOOD  URINALYSIS, COMPLETE (UACMP) WITH MICROSCOPIC   ____________________________________________  EKG  ED ECG REPORT I, Merwyn Katos, the attending physician, personally viewed and interpreted this ECG.  Date: 11/28/2019 EKG Time: 0938 Rate: 93 Rhythm: normal sinus rhythm QRS Axis: normal Intervals: normal ST/T Wave abnormalities: normal Narrative Interpretation: no evidence of acute ischemia  ____________________________________________  RADIOLOGY  ED MD interpretation: CT of the abdomen and pelvis with contrast shows moderate right hydronephrosis and hydroureter with a 2-3 mm calculus at the right UVJ concerning for a right kidney stone  Official radiology report(s): CT ABDOMEN PELVIS W CONTRAST  Result Date: 11/28/2019 CLINICAL DATA:  Abdominal pain, nausea, vomiting, history of cholecystectomy EXAM: CT ABDOMEN AND PELVIS WITH CONTRAST TECHNIQUE: Multidetector CT imaging of the abdomen and pelvis was performed using the standard protocol following bolus administration of intravenous contrast. CONTRAST:  OMNIPAQUE IOHEXOL 300 MG/ML  SOLN COMPARISON:  None. FINDINGS: Lower chest: No acute abnormality. Hepatobiliary: No focal liver abnormality is seen. Hepatic steatosis. Status post cholecystectomy. Mild postoperative biliary dilatation. Pancreas: Unremarkable. No pancreatic ductal dilatation or surrounding  inflammatory changes. Spleen: Normal in size without significant abnormality. Adrenals/Urinary Tract: Adrenal glands are unremarkable. There is moderate right hydronephrosis and hydroureter with a 2-3 mm calculus at the right ureterovesicular junction and a delayed right nephrogram (series 2, image 78). Bladder is unremarkable. Stomach/Bowel: Stomach is within normal limits. Appendix appears normal. No evidence of bowel wall thickening, distention, or inflammatory changes. Vascular/Lymphatic: Aortic atherosclerosis. No enlarged abdominal or pelvic lymph nodes. Reproductive: No mass or other significant abnormality. Other: No abdominal wall hernia or abnormality. No abdominopelvic ascites. Musculoskeletal: No acute or significant osseous findings. IMPRESSION: 1. There is moderate right hydronephrosis and hydroureter with a 2-3 mm calculus at the right ureterovesicular junction and a delayed right nephrogram. No additional urinary tract calculi are identified, however assessment of the left-sided calices is significantly limited by contrast excretion. 2. Hepatic steatosis. 3. Status post cholecystectomy. 4. Aortic Atherosclerosis (ICD10-I70.0). Electronically Signed   By: Lauralyn Primes M.D.   On: 11/28/2019 11:17    ____________________________________________   PROCEDURES  Procedure(s) performed (including Critical Care):  Procedures   ____________________________________________   INITIAL IMPRESSION / ASSESSMENT AND PLAN / ED COURSE  As part of my medical decision making, I reviewed the following data within the electronic MEDICAL RECORD NUMBER Nursing notes reviewed and incorporated, Labs reviewed, EKG interpreted, Old chart reviewed, Radiograph reviewed and Notes from prior ED  visits reviewed and incorporated        Patient presents for severe flank pain. Presentation most consistent with Renal Colic from a Non-infected Kidney Stone. Given History and Exam I have lower suspicion for atypical  appendicitis, genital torsion, acute cholecystitis, AAA, Aortic Dissection, Serious Bacterial Illness or other emergent intraabdominal pathology.  Workup: CBC, BMP, CT Abd/Pelvis noncontrast, UA, reassess Findings: 2-3 mm right-sided kidney stone at the UVJ Reassesment: Patient tolerating PO and pain controlled Disposition:  Discharge. Strict return precautions for infected stone or PO intolerance discussed.      ____________________________________________   FINAL CLINICAL IMPRESSION(S) / ED DIAGNOSES  Final diagnoses:  Kidney stone on right side  Right lateral abdominal pain     ED Discharge Orders    None       Note:  This document was prepared using Dragon voice recognition software and may include unintentional dictation errors.   Merwyn Katos, MD 11/28/19 [id number]

## 2019-11-28 NOTE — ED Triage Notes (Signed)
Here for RUQ pain that started today with vomiting. Has had gallbladder removed and at that time had bile duct "cleared out".  No fever. No diarrhea.  Feels similar to pain with gallbladder.

## 2019-11-28 NOTE — ED Notes (Signed)
Pt signed paper copy of discharge paperwork. Pt states that concerns or questions at this time,. Pt ambulatory to lobby without difficulty or distress.

## 2019-12-06 ENCOUNTER — Telehealth: Payer: Self-pay

## 2019-12-07 ENCOUNTER — Encounter: Payer: Self-pay | Admitting: Internal Medicine

## 2019-12-07 ENCOUNTER — Other Ambulatory Visit: Payer: Self-pay

## 2019-12-07 ENCOUNTER — Ambulatory Visit: Payer: Managed Care, Other (non HMO) | Admitting: Internal Medicine

## 2019-12-07 VITALS — BP 118/78 | HR 108 | Temp 97.3°F | Ht 63.0 in | Wt 201.0 lb

## 2019-12-07 DIAGNOSIS — R7303 Prediabetes: Secondary | ICD-10-CM | POA: Diagnosis not present

## 2019-12-07 DIAGNOSIS — E7800 Pure hypercholesterolemia, unspecified: Secondary | ICD-10-CM

## 2019-12-07 DIAGNOSIS — F5101 Primary insomnia: Secondary | ICD-10-CM

## 2019-12-07 DIAGNOSIS — Z6835 Body mass index (BMI) 35.0-35.9, adult: Secondary | ICD-10-CM | POA: Diagnosis not present

## 2019-12-07 DIAGNOSIS — F330 Major depressive disorder, recurrent, mild: Secondary | ICD-10-CM

## 2019-12-07 MED ORDER — ESCITALOPRAM OXALATE 20 MG PO TABS: 20.0000 mg | ORAL_TABLET | Freq: Every day | ORAL | 1 refills | Status: DC

## 2019-12-07 MED ORDER — PRAVASTATIN SODIUM 40 MG PO TABS: 40.0000 mg | ORAL_TABLET | Freq: Every day | ORAL | 1 refills | Status: DC

## 2019-12-07 MED ORDER — ZALEPLON 10 MG PO CAPS: 10.0000 mg | ORAL_CAPSULE | Freq: Every evening | ORAL | 0 refills | Status: DC | PRN

## 2019-12-07 NOTE — Progress Notes (Signed)
Date:  12/07/2019   Name:  Rebecca Greer   DOB:  [birth year]   MRN:  [id number]   Chief Complaint: Establish Care and Medication Refill (been without meds for 4 months )  Depression        This is a chronic problem.  The most recent episode lasted 15 years.    The problem has been gradually worsening (since being out of medication) since onset.  Associated symptoms include insomnia and decreased interest.  Associated symptoms include no decreased concentration, no fatigue, no appetite change, no headaches and no suicidal ideas.     Exacerbated by: work issues.  Past treatments include SSRIs - Selective serotonin reuptake inhibitors.  Previous treatment provided significant relief. Hyperlipidemia This is a chronic problem. The problem is uncontrolled. Pertinent negatives include no chest pain or shortness of breath. She is currently on no antihyperlipidemic treatment (previously on statin medication).  Insomnia Primary symptoms: sleep disturbance, difficulty falling asleep.  The problem occurs nightly. Past treatments include medication. PMH includes: depression.  Prediabetes - recent labs at [facility] showed an elevated A1C around 6.0. she is very concerned and wants to make changes to avoid progression to DM.  Lab Results  Component Value Date   CREATININE 0.76 11/28/2019   BUN 14 11/28/2019   NA 139 11/28/2019   K 4.2 11/28/2019   CL 105 11/28/2019   CO2 23 11/28/2019   Lab Results  Component Value Date   CHOL 275 (H) 11/04/2018   HDL 46 11/04/2018   LDLCALC 184 (H) 11/04/2018   TRIG 237 (H) 11/04/2018   CHOLHDL 6.0 (H) 11/04/2018   Lab Results  Component Value Date   TSH 2.740 11/04/2018   Lab Results  Component Value Date   HGBA1C 5.9 (H) 11/04/2018   Lab Results  Component Value Date   WBC 6.8 11/28/2019   HGB 15.1 (H) 11/28/2019   HCT 42.7 11/28/2019   MCV 86.1 11/28/2019   PLT 182 11/28/2019   Lab Results  Component Value Date   ALT 47 (H) 11/28/2019    AST 29 11/28/2019   ALKPHOS 63 11/28/2019   BILITOT 0.6 11/28/2019     Review of Systems  Constitutional: Negative for appetite change, chills, fatigue, fever and unexpected weight change.  HENT: Negative for trouble swallowing.   Respiratory: Negative for cough, chest tightness and shortness of breath.   Cardiovascular: Negative for chest pain, palpitations and leg swelling.  Gastrointestinal: Negative for abdominal pain, constipation and diarrhea.  Musculoskeletal: Negative for arthralgias and gait problem.  Neurological: Negative for dizziness and headaches.  Psychiatric/Behavioral: Positive for depression, dysphoric mood and sleep disturbance. Negative for decreased concentration and suicidal ideas. The patient is nervous/anxious and has insomnia.     Patient Active Problem List   Diagnosis Date Noted  . Depression, major, single episode, complete remission (HCC) 11/04/2018  . Atypical chest pain 07/04/2015  . BP (high blood pressure) 12/12/2014  . Blood glucose elevated 11/16/2014  . Adjustment disorder with anxiety 04/07/2009  . Hypercholesteremia 12/23/2008  . Allergic rhinitis 11/16/2008  . Cannot sleep 08/18/2008    No Known Allergies  Past Surgical History:  Procedure Laterality Date  . CHOLECYSTECTOMY      Social History   Tobacco Use  . Smoking status: Former Smoker    Types: Cigarettes    Quit date: 02/24/1994    Years since quitting: 25.8  . Smokeless tobacco: Never Used  Vaping Use  . Vaping Use: Never used  Substance Use  Topics  . Alcohol use: Yes    Comment: Rarely  . Drug use: No     Medication list has been reviewed and updated.  Current Meds  Medication Sig  . Multiple Vitamin (MULTI-VITAMIN PO) Take by mouth daily.  Marland Kitchen UNABLE TO FIND Med Name: [facility] [place] - for sleep    PHQ 2/9 Scores 12/07/2019 11/04/2018 05/09/2016  PHQ - 2 Score 2 2 4   PHQ- 9 Score 7 8 16     GAD 7 : Generalized Anxiety Score 12/07/2019 05/09/2016  Nervous, Anxious, on  Edge 1 3  Control/stop worrying 0 1  Worry too much - different things 0 0  Trouble relaxing 2 0  Restless 0 0  Easily annoyed or irritable 2 3  Afraid - awful might happen 0 0  Total GAD 7 Score 5 7  Anxiety Difficulty Not difficult at all Very difficult    BP Readings from Last 3 Encounters:  12/07/19 118/78  11/28/19 132/88  11/04/18 (!) 134/91    Physical Exam Vitals and nursing note reviewed.  Constitutional:      General: She is not in acute distress.    Appearance: Normal appearance. She is well-developed. She is obese.  HENT:     Head: Normocephalic and atraumatic.  Neck:     Vascular: No carotid bruit.  Cardiovascular:     Rate and Rhythm: Normal rate and regular rhythm.     Pulses: Normal pulses.     Heart sounds: No murmur heard.   Pulmonary:     Effort: Pulmonary effort is normal. No respiratory distress.     Breath sounds: No wheezing or rhonchi.  Musculoskeletal:     Cervical back: Normal range of motion.     Right lower leg: No edema.     Left lower leg: No edema.  Lymphadenopathy:     Cervical: No cervical adenopathy.  Skin:    General: Skin is warm and dry.     Capillary Refill: Capillary refill takes less than 2 seconds.     Findings: No rash.  Neurological:     General: No focal deficit present.     Mental Status: She is alert and oriented to person, place, and time.  Psychiatric:        Mood and Affect: Mood normal.        Behavior: Behavior normal.     Wt Readings from Last 3 Encounters:  12/07/19 201 lb (91.2 kg)  11/28/19 200 lb (90.7 kg)  11/04/18 209 lb (94.8 kg)    BP 118/78 (BP Location: Right Arm, Patient Position: Sitting)   Pulse (!) 108   Temp (!) 97.3 F (36.3 C) (Oral)   Ht 5\' 3"  (1.6 m)   Wt 201 lb (91.2 kg)   LMP 12/12/2016   SpO2 94%   BMI 35.61 kg/m   Assessment and Plan: 1. Depression, major, recurrent, mild (HCC) Recurrent sx with anxiety since stopping medication No past side effects to Lexapro No  SI/HI. Resume previous therapy - follow up in 3 mo. - escitalopram (LEXAPRO) 20 MG tablet; Take 1 tablet (20 mg total) by mouth daily.  Dispense: 90 tablet; Refill: 1  2. Hypercholesteremia Resume pravastatin for primary prevention - pravastatin (PRAVACHOL) 40 MG tablet; Take 1 tablet (40 mg total) by mouth daily.  Dispense: 90 tablet; Refill: 1  3. BMI 35.0-35.9,adult BMI exemption form completed Plan for 30-45 min exercise per day; cut out snacks and soda, reduce intake of carbs  4. Prediabetes Stressed weight loss  as primary treatment Follow up A1C next visit  5. Primary insomnia Hopefully will improve with resumption of Lexapro Will try sonata at bedtime as needed to help with sleep onset - zaleplon (SONATA) 10 MG capsule; Take 1 capsule (10 mg total) by mouth at bedtime as needed for sleep.  Dispense: 30 capsule; Refill: 0   Partially dictated using Animal nutritionist. Any errors are unintentional.  Bari Edward, MD [facility]  12/07/2019

## 2019-12-07 NOTE — Telephone Encounter (Signed)
error 

## 2019-12-28 ENCOUNTER — Other Ambulatory Visit: Payer: Self-pay | Admitting: Internal Medicine

## 2019-12-28 DIAGNOSIS — F5101 Primary insomnia: Secondary | ICD-10-CM

## 2019-12-29 MED ORDER — ZALEPLON 10 MG PO CAPS: 10.0000 mg | ORAL_CAPSULE | Freq: Every evening | ORAL | 0 refills | Status: DC | PRN

## 2019-12-29 NOTE — Telephone Encounter (Signed)
Please Advise. Last office visit 12/07/2019.  KP

## 2020-02-04 ENCOUNTER — Encounter: Payer: Self-pay | Admitting: Internal Medicine

## 2020-02-04 ENCOUNTER — Other Ambulatory Visit: Payer: Self-pay | Admitting: Internal Medicine

## 2020-02-04 DIAGNOSIS — F5101 Primary insomnia: Secondary | ICD-10-CM

## 2020-02-04 MED ORDER — ZALEPLON 10 MG PO CAPS: 10.0000 mg | ORAL_CAPSULE | Freq: Every evening | ORAL | 0 refills | Status: DC | PRN

## 2020-03-15 ENCOUNTER — Ambulatory Visit: Payer: Managed Care, Other (non HMO) | Admitting: Internal Medicine

## 2020-03-30 ENCOUNTER — Other Ambulatory Visit: Payer: Self-pay

## 2020-03-30 ENCOUNTER — Ambulatory Visit: Payer: Managed Care, Other (non HMO) | Admitting: Internal Medicine

## 2020-03-30 ENCOUNTER — Encounter: Payer: Self-pay | Admitting: Internal Medicine

## 2020-03-30 VITALS — BP 116/74 | HR 102 | Temp 98.1°F | Ht 63.0 in | Wt 195.0 lb

## 2020-03-30 DIAGNOSIS — F5101 Primary insomnia: Secondary | ICD-10-CM | POA: Diagnosis not present

## 2020-03-30 DIAGNOSIS — R7303 Prediabetes: Secondary | ICD-10-CM | POA: Diagnosis not present

## 2020-03-30 DIAGNOSIS — E7800 Pure hypercholesterolemia, unspecified: Secondary | ICD-10-CM | POA: Diagnosis not present

## 2020-03-30 DIAGNOSIS — F325 Major depressive disorder, single episode, in full remission: Secondary | ICD-10-CM | POA: Diagnosis not present

## 2020-03-30 DIAGNOSIS — Z1231 Encounter for screening mammogram for malignant neoplasm of breast: Secondary | ICD-10-CM

## 2020-03-30 MED ORDER — ZALEPLON 10 MG PO CAPS: 10.0000 mg | ORAL_CAPSULE | Freq: Every evening | ORAL | 1 refills | Status: DC | PRN

## 2020-03-30 NOTE — Progress Notes (Signed)
Date:  03/30/2020   Name:  Rebecca Greer   DOB:  Jul 17, 1960   MRN:  [id number]   Chief Complaint: Depression, Hyperlipidemia, and Prediabetes  Depression        This is a chronic problem.  The problem has been resolved since onset.  Associated symptoms include insomnia.  Associated symptoms include no fatigue, no appetite change and no headaches.  Past treatments include SSRIs - Selective serotonin reuptake inhibitors (resumed lexapro last visit).  Compliance with treatment is good.  Previous treatment provided significant relief. Diabetes She presents for her follow-up diabetic visit. Diabetes type: prediabetes. Pertinent negatives for hypoglycemia include no headaches or tremors. Pertinent negatives for diabetes include no chest pain, no fatigue, no polydipsia and no polyuria. Her weight is decreasing steadily. An ACE inhibitor/angiotensin II receptor blocker is not being taken.  Hyperlipidemia This is a chronic problem. Pertinent negatives include no chest pain or shortness of breath. Current antihyperlipidemic treatment includes statins (resumed last visit). There are no compliance problems.   Insomnia Primary symptoms: difficulty falling asleep.  The problem has been resolved since onset. Past treatments include medication. The treatment provided significant (sonata doing well with no daytime somnolence or morning medication residual) relief. PMH includes: depression.    Lab Results  Component Value Date   CREATININE 0.76 11/28/2019   BUN 14 11/28/2019   NA 139 11/28/2019   K 4.2 11/28/2019   CL 105 11/28/2019   CO2 23 11/28/2019   Lab Results  Component Value Date   CHOL 275 (H) 11/04/2018   HDL 46 11/04/2018   LDLCALC 184 (H) 11/04/2018   TRIG 237 (H) 11/04/2018   CHOLHDL 6.0 (H) 11/04/2018   Lab Results  Component Value Date   TSH 2.740 11/04/2018   Lab Results  Component Value Date   HGBA1C 5.9 (H) 11/04/2018   Lab Results  Component Value Date   WBC 6.8  11/28/2019   HGB 15.1 (H) 11/28/2019   HCT 42.7 11/28/2019   MCV 86.1 11/28/2019   PLT 182 11/28/2019   Lab Results  Component Value Date   ALT 47 (H) 11/28/2019   AST 29 11/28/2019   ALKPHOS 63 11/28/2019   BILITOT 0.6 11/28/2019     Review of Systems  Constitutional: Negative for appetite change, fatigue, fever and unexpected weight change.  HENT: Negative for tinnitus and trouble swallowing.   Eyes: Negative for visual disturbance.  Respiratory: Negative for cough, chest tightness and shortness of breath.   Cardiovascular: Negative for chest pain, palpitations and leg swelling.  Gastrointestinal: Negative for abdominal pain.  Endocrine: Negative for polydipsia and polyuria.  Genitourinary: Negative for dysuria and hematuria.  Musculoskeletal: Negative for arthralgias.  Neurological: Negative for tremors, numbness and headaches.  Psychiatric/Behavioral: Positive for depression. Negative for dysphoric mood. The patient has insomnia.     Patient Active Problem List   Diagnosis Date Noted  . BMI 35.0-35.9,adult 12/07/2019  . Depression, major, single episode, complete remission (HCC) 11/04/2018  . Atypical chest pain 07/04/2015  . Prediabetes 11/16/2014  . Adjustment disorder with anxiety 04/07/2009  . Hypercholesteremia 12/23/2008  . Allergic rhinitis 11/16/2008  . Primary insomnia 08/18/2008    No Known Allergies  Past Surgical History:  Procedure Laterality Date  . CHOLECYSTECTOMY      Social History   Tobacco Use  . Smoking status: Former Smoker    Types: Cigarettes    Quit date: 02/24/1994    Years since quitting: 26.1  . Smokeless tobacco: Never Used  Vaping Use  . Vaping Use: Never used  Substance Use Topics  . Alcohol use: Yes    Comment: Rarely  . Drug use: No     Medication list has been reviewed and updated.  Current Meds  Medication Sig  . escitalopram (LEXAPRO) 20 MG tablet Take 1 tablet (20 mg total) by mouth daily.  . Multiple  Vitamin (MULTI-VITAMIN PO) Take by mouth daily.  . pravastatin (PRAVACHOL) 40 MG tablet Take 1 tablet (40 mg total) by mouth daily.  Marland Kitchen UNABLE TO FIND Med Name: [place] - for sleep  . zaleplon (SONATA) 10 MG capsule Take 1 capsule (10 mg total) by mouth at bedtime as needed for sleep.    PHQ 2/9 Scores 03/30/2020 12/07/2019 11/04/2018 05/09/2016  PHQ - 2 Score 0 2 2 4   PHQ- 9 Score 0 7 8 16     GAD 7 : Generalized Anxiety Score 03/30/2020 12/07/2019 05/09/2016  Nervous, Anxious, on Edge 0 1 3  Control/stop worrying 0 0 1  Worry too much - different things 0 0 0  Trouble relaxing 0 2 0  Restless 0 0 0  Easily annoyed or irritable 0 2 3  Afraid - awful might happen 0 0 0  Total GAD 7 Score 0 5 7  Anxiety Difficulty - Not difficult at all Very difficult    BP Readings from Last 3 Encounters:  03/30/20 116/74  12/07/19 118/78  11/28/19 132/88    Physical Exam Vitals and nursing note reviewed.  Constitutional:      General: She is not in acute distress.    Appearance: She is well-developed.  HENT:     Head: Normocephalic and atraumatic.  Cardiovascular:     Rate and Rhythm: Normal rate and regular rhythm.     Pulses: Normal pulses.     Heart sounds: No murmur heard.   Pulmonary:     Effort: Pulmonary effort is normal. No respiratory distress.     Breath sounds: No wheezing or rhonchi.  Musculoskeletal:     Cervical back: Normal range of motion.     Right lower leg: No edema.     Left lower leg: No edema.  Lymphadenopathy:     Cervical: No cervical adenopathy.  Skin:    General: Skin is warm and dry.     Findings: No rash.  Neurological:     General: No focal deficit present.     Mental Status: She is alert and oriented to person, place, and time.  Psychiatric:        Attention and Perception: Attention normal.        Mood and Affect: Mood and affect and mood normal.        Behavior: Behavior normal.        Thought Content: Thought content does not include suicidal  ideation. Thought content does not include suicidal plan.     Wt Readings from Last 3 Encounters:  03/30/20 195 lb (88.5 kg)  12/07/19 201 lb (91.2 kg)  11/28/19 200 lb (90.7 kg)    BP 116/74   Pulse (!) 102   Temp 98.1 F (36.7 C) (Oral)   Ht 5\' 3"  (1.6 m)   Wt 195 lb (88.5 kg)   LMP 12/12/2016   SpO2 97%   BMI 34.54 kg/m   Assessment and Plan: 1. Prediabetes Doing well with diet, has lost some weight Continue current regimen of diet control - Hemoglobin A1c  2. Depression, major, single episode, complete remission (HCC) Clinically stable on  current regimen with good control of symptoms, No SI or HI. Will continue current therapy.  3. Hypercholesteremia Now on statin therapy again - no side effects or compliance issues - Lipid panel - Comprehensive metabolic panel  4. Primary insomnia Sleeping well with no side effects Continue nightly if needed - zaleplon (SONATA) 10 MG capsule; Take 1 capsule (10 mg total) by mouth at bedtime as needed for sleep.  Dispense: 90 capsule; Refill: 1  5. Encounter for screening mammogram for breast cancer - MM 3D SCREEN BREAST BILATERAL; Future   Partially dictated using Animal nutritionist. Any errors are unintentional.  Bari Edward, MD [facility]  03/30/2020

## 2020-04-05 ENCOUNTER — Telehealth: Payer: Self-pay

## 2020-04-05 NOTE — Telephone Encounter (Signed)
-----   Message from Reubin Milan, MD sent at 04/04/2020  9:12 AM EST ----- Call her to get her labs done.  ----- Message ----- From: SYSTEM Sent: 04/04/2020  12:10 AM EST To: Reubin Milan, MD

## 2020-04-05 NOTE — Telephone Encounter (Signed)
Called patient and left a VM letting her know she needs to get her labs drawn. Told her to call back if she has any issues or concerns.

## 2020-04-08 LAB — COMPREHENSIVE METABOLIC PANEL
ALT: 32 IU/L (ref 0–32)
AST: 25 IU/L (ref 0–40)
Albumin/Globulin Ratio: 1.8 (ref 1.2–2.2)
Albumin: 4.5 g/dL (ref 3.8–4.9)
Alkaline Phosphatase: 76 IU/L (ref 44–121)
BUN/Creatinine Ratio: 18 (ref 9–23)
BUN: 15 mg/dL (ref 6–24)
Bilirubin Total: 0.4 mg/dL (ref 0.0–1.2)
CO2: 21 mmol/L (ref 20–29)
Calcium: 9.2 mg/dL (ref 8.7–10.2)
Chloride: 103 mmol/L (ref 96–106)
Creatinine, Ser: 0.85 mg/dL (ref 0.57–1.00)
GFR calc Af Amer: 88 mL/min/{1.73_m2} (ref >59)
GFR calc non Af Amer: 76 mL/min/{1.73_m2} (ref >59)
Globulin, Total: 2.5 g/dL (ref 1.5–4.5)
Glucose: 104 mg/dL — ABNORMAL HIGH (ref 65–99)
Potassium: 4.5 mmol/L (ref 3.5–5.2)
Sodium: 140 mmol/L (ref 134–144)
Total Protein: 7 g/dL (ref 6.0–8.5)

## 2020-04-08 LAB — LIPID PANEL
Chol/HDL Ratio: 4.3 ratio (ref 0.0–4.4)
Cholesterol, Total: 211 mg/dL — ABNORMAL HIGH (ref 100–199)
HDL: 49 mg/dL (ref >39)
LDL Chol Calc (NIH): 133 mg/dL — ABNORMAL HIGH (ref 0–99)
Triglycerides: 160 mg/dL — ABNORMAL HIGH (ref 0–149)
VLDL Cholesterol Cal: 29 mg/dL (ref 5–40)

## 2020-04-08 LAB — HEMOGLOBIN A1C
Est. average glucose Bld gHb Est-mCnc: 117 mg/dL
Hgb A1c MFr Bld: 5.7 % — ABNORMAL HIGH (ref 4.8–5.6)

## 2020-05-29 ENCOUNTER — Other Ambulatory Visit: Payer: Self-pay | Admitting: Internal Medicine

## 2020-05-29 DIAGNOSIS — F330 Major depressive disorder, recurrent, mild: Secondary | ICD-10-CM

## 2020-05-29 DIAGNOSIS — E7800 Pure hypercholesterolemia, unspecified: Secondary | ICD-10-CM

## 2020-08-29 ENCOUNTER — Other Ambulatory Visit: Payer: Self-pay | Admitting: Internal Medicine

## 2020-08-29 DIAGNOSIS — E7800 Pure hypercholesterolemia, unspecified: Secondary | ICD-10-CM

## 2020-08-29 DIAGNOSIS — F330 Major depressive disorder, recurrent, mild: Secondary | ICD-10-CM

## 2020-09-05 ENCOUNTER — Telehealth: Payer: Self-pay

## 2020-09-05 NOTE — Telephone Encounter (Signed)
Please call patient to schedule physical with pap smear. She is over due. Thank you.

## 2020-10-28 ENCOUNTER — Other Ambulatory Visit: Payer: Self-pay | Admitting: Internal Medicine

## 2020-10-28 DIAGNOSIS — F5101 Primary insomnia: Secondary | ICD-10-CM

## 2020-10-28 NOTE — Telephone Encounter (Signed)
Requested medication (s) are due for refill today: yes  Requested medication (s) are on the active medication list: yes  Last refill:  03/30/20 #90 1 RF  Future visit scheduled:   Notes to clinic:  called pt and LM on VM to call office and schedule OV- call back number provided.   Requested Prescriptions  Pending Prescriptions Disp Refills   zaleplon (SONATA) 10 MG capsule [Pharmacy Med Name: ZALEPLON 10MG  CAPSULES] 90 capsule     Sig: TAKE 1 CAPSULE(10 MG) BY MOUTH AT BEDTIME AS NEEDED FOR SLEEP     Not Delegated - Psychiatry:  Anxiolytics/Hypnotics Failed - 10/28/2020 12:03 PM      Failed - This refill cannot be delegated      Failed - Urine Drug Screen completed in last 360 days      Failed - Valid encounter within last 6 months    Recent Outpatient Visits           7 months ago Prediabetes   [facility], [place]   10 months ago Depression, major, recurrent, mild [facility])   [facility], MD   1 year ago Hypercholesteremia   [facility] [place], [place], M   2 years ago Obesity (BMI 30.0-34.9)   [facility], [place], [place]   3 years ago Hypercholesteremia   [facility] [place], [place], Lavella Hammock       Future Appointments             In 3 months [place], Judithann Graves, MD [facility], [facility]

## 2020-10-31 NOTE — Telephone Encounter (Signed)
Please review. Last office visit 03/30/2020.  KP

## 2020-11-30 ENCOUNTER — Other Ambulatory Visit: Payer: Self-pay | Admitting: Internal Medicine

## 2020-11-30 DIAGNOSIS — E7800 Pure hypercholesterolemia, unspecified: Secondary | ICD-10-CM

## 2020-11-30 DIAGNOSIS — F330 Major depressive disorder, recurrent, mild: Secondary | ICD-10-CM

## 2020-11-30 NOTE — Telephone Encounter (Signed)
Requested Prescriptions  Pending Prescriptions Disp Refills  . escitalopram (LEXAPRO) 20 MG tablet [Pharmacy Med Name: ESCITALOPRAM 20MG  TABLETS] 90 tablet 0    Sig: TAKE 1 TABLET(20 MG) BY MOUTH DAILY     Psychiatry:  Antidepressants - SSRI Failed - 11/30/2020  3:37 AM      Failed - Valid encounter within last 6 months    Recent Outpatient Visits          8 months ago Prediabetes   [facility], MD   11 months ago Depression, major, recurrent, mild [facility])   [facility], MD   2 years ago Hypercholesteremia   [facility] [place], [place], M   2 years ago Obesity (BMI 30.0-34.9)   [facility] [place], [place], [place]   3 years ago Hypercholesteremia   [facility], Trey Sailors      Future Appointments            In 2 months [place] Judithann Graves, MD [facility], [facility]           Passed - Completed PHQ-2 or PHQ-9 in the last 360 days      . pravastatin (PRAVACHOL) 40 MG tablet [Pharmacy Med Name: PRAVASTATIN 40MG  TABLETS] 90 tablet 0    Sig: TAKE 1 TABLET(40 MG) BY MOUTH DAILY     Cardiovascular:  Antilipid - Statins Failed - 11/30/2020  3:37 AM      Failed - Total Cholesterol in normal range and within 360 days    Cholesterol, Total  Date Value Ref Range Status  04/07/2020 211 (H) 100 - 199 mg/dL Final         Failed - LDL in normal range and within 360 days    LDL Chol Calc (NIH)  Date Value Ref Range Status  04/07/2020 133 (H) 0 - 99 mg/dL Final         Failed - Triglycerides in normal range and within 360 days    Triglycerides  Date Value Ref Range Status  04/07/2020 160 (H) 0 - 149 mg/dL Final         Passed - HDL in normal range and within 360 days    HDL  Date Value Ref Range Status  04/07/2020 49 >39 mg/dL Final         Passed - Patient is not pregnant      Passed - Valid encounter within last 12 months    Recent Outpatient Visits           8 months ago Prediabetes   [facility] 06/05/2020, MD   11 months ago Depression, major, recurrent, mild [facility])   [facility] Reubin Milan, MD   2 years ago Hypercholesteremia   [facility] [place], [facility] M, Ricki Rodriguez   2 years ago Obesity (BMI 30.0-34.9)   [facility] [place], [place], [place]   3 years ago Hypercholesteremia   [facility] [place], [facility], [place]      Future Appointments            In 2 months [place] Hammock, [place], MD [facility], [facility]

## 2021-01-26 ENCOUNTER — Other Ambulatory Visit: Payer: Self-pay | Admitting: Internal Medicine

## 2021-01-26 DIAGNOSIS — F5101 Primary insomnia: Secondary | ICD-10-CM

## 2021-01-27 NOTE — Telephone Encounter (Signed)
Requested medication (s) are due for refill today: yes  Requested medication (s) are on the active medication list: yes  Last refill:  10/31/20  Future visit scheduled: 02/16/21  Notes to clinic:  This medication can not be delegated, please assess.     Requested Prescriptions  Pending Prescriptions Disp Refills   zaleplon (SONATA) 10 MG capsule [Pharmacy Med Name: ZALEPLON 10MG  CAPSULES] 90 capsule     Sig: TAKE 1 CAPSULE(10 MG) BY MOUTH AT BEDTIME AS NEEDED FOR SLEEP     Not Delegated - Psychiatry:  Anxiolytics/Hypnotics Failed - 01/26/2021  8:11 AM      Failed - This refill cannot be delegated      Failed - Urine Drug Screen completed in last 360 days      Failed - Valid encounter within last 6 months    Recent Outpatient Visits           10 months ago Prediabetes   [facility] 14/03/2020, MD   1 year ago Depression, major, recurrent, mild [facility])   [facility], MD   2 years ago Hypercholesteremia   [facility] [place], [place] M, M   3 years ago Obesity (BMI 30.0-34.9)   [facility] [place], [place], [place]   4 years ago Hypercholesteremia   [facility] [place], [place], Lavella Hammock       Future Appointments             In 2 weeks [place], Judithann Graves, MD [facility], [facility]

## 2021-01-29 NOTE — Telephone Encounter (Signed)
Please review. Last office visit 03/30/20.  KP

## 2021-01-29 NOTE — Telephone Encounter (Signed)
Please review. Last office visit 03/30/2020.  KP

## 2021-02-16 ENCOUNTER — Encounter: Payer: Managed Care, Other (non HMO) | Admitting: Internal Medicine

## 2021-03-01 ENCOUNTER — Other Ambulatory Visit: Payer: Self-pay | Admitting: Internal Medicine

## 2021-03-01 DIAGNOSIS — Z1231 Encounter for screening mammogram for malignant neoplasm of breast: Secondary | ICD-10-CM

## 2021-03-09 ENCOUNTER — Other Ambulatory Visit: Payer: Self-pay

## 2021-03-09 ENCOUNTER — Ambulatory Visit
Admission: RE | Admit: 2021-03-09 | Discharge: 2021-03-09 | Disposition: A | Discharge status: Home / Self Care | Payer: Managed Care, Other (non HMO) | Source: Ambulatory Visit | Attending: Internal Medicine | Admitting: Internal Medicine

## 2021-03-09 DIAGNOSIS — Z1231 Encounter for screening mammogram for malignant neoplasm of breast: Secondary | ICD-10-CM | POA: Diagnosis present

## 2021-03-29 ENCOUNTER — Other Ambulatory Visit: Payer: Self-pay

## 2021-03-29 ENCOUNTER — Encounter: Payer: Self-pay | Admitting: Internal Medicine

## 2021-03-29 ENCOUNTER — Other Ambulatory Visit (HOSPITAL_COMMUNITY)
Admission: RE | Admit: 2021-03-29 | Discharge: 2021-03-29 | Disposition: A | Discharge status: Home / Self Care | Payer: Managed Care, Other (non HMO) | Source: Ambulatory Visit | Attending: Internal Medicine | Admitting: Internal Medicine

## 2021-03-29 ENCOUNTER — Ambulatory Visit (INDEPENDENT_AMBULATORY_CARE_PROVIDER_SITE_OTHER): Payer: Managed Care, Other (non HMO) | Admitting: Internal Medicine

## 2021-03-29 VITALS — BP 104/72 | HR 90 | Ht 63.0 in | Wt 190.0 lb

## 2021-03-29 DIAGNOSIS — Z0000 Encounter for general adult medical examination without abnormal findings: Secondary | ICD-10-CM

## 2021-03-29 DIAGNOSIS — E7800 Pure hypercholesterolemia, unspecified: Secondary | ICD-10-CM

## 2021-03-29 DIAGNOSIS — R7303 Prediabetes: Secondary | ICD-10-CM

## 2021-03-29 DIAGNOSIS — Z124 Encounter for screening for malignant neoplasm of cervix: Secondary | ICD-10-CM

## 2021-03-29 DIAGNOSIS — F325 Major depressive disorder, single episode, in full remission: Secondary | ICD-10-CM

## 2021-03-29 MED ORDER — ESCITALOPRAM OXALATE 20 MG PO TABS: ORAL_TABLET | ORAL | 3 refills | Status: DC

## 2021-03-29 MED ORDER — PRAVASTATIN SODIUM 40 MG PO TABS: ORAL_TABLET | ORAL | 3 refills | Status: DC

## 2021-03-29 NOTE — Progress Notes (Signed)
Date:  03/29/2021   Name:  CHERAMIE LINNE   DOB:  1960/09/20   MRN:  [id number]   Chief Complaint: Annual Exam (Breast exam with pap ) Rebecca Greer is a 61 y.o. female who presents today for her Complete Annual Exam. She feels well. She reports exercising walking dog daily 20 -30 min. She reports she is sleeping well. Breast complaints none.  Mammogram: 02/2021 DEXA: none Pap smear: 09/2012 Colonoscopy: 2014 repeat 2024  Immunization History  Administered Date(s) Administered   Influenza,inj,Quad PF,6+ Mos 01/02/2015, 01/10/2017   Influenza-Unspecified 12/18/2015   Tdap 01/10/2017    Depression        This is a chronic problem.The problem is unchanged.  Associated symptoms include insomnia.  Associated symptoms include no fatigue and no headaches.  Past treatments include SSRIs - Selective serotonin reuptake inhibitors.  Compliance with treatment is good.  Previous treatment provided significant relief. Hyperlipidemia This is a chronic problem. The problem is controlled. Pertinent negatives include no chest pain or shortness of breath. Current antihyperlipidemic treatment includes statins. The current treatment provides moderate improvement of lipids.  Insomnia Primary symptoms: no sleep disturbance.   The problem occurs nightly. Past treatments include medication. The treatment provided significant relief. PMH includes: depression.    Lab Results  Component Value Date   NA 140 04/07/2020   K 4.5 04/07/2020   CO2 21 04/07/2020   GLUCOSE 104 (H) 04/07/2020   BUN 15 04/07/2020   CREATININE 0.85 04/07/2020   CALCIUM 9.2 04/07/2020   GFRNONAA 76 04/07/2020   Lab Results  Component Value Date   CHOL 211 (H) 04/07/2020   HDL 49 04/07/2020   LDLCALC 133 (H) 04/07/2020   TRIG 160 (H) 04/07/2020   CHOLHDL 4.3 04/07/2020   Lab Results  Component Value Date   TSH 2.740 11/04/2018   Lab Results  Component Value Date   HGBA1C 5.7 (H) 04/07/2020   Lab Results   Component Value Date   WBC 6.8 11/28/2019   HGB 15.1 (H) 11/28/2019   HCT 42.7 11/28/2019   MCV 86.1 11/28/2019   PLT 182 11/28/2019   Lab Results  Component Value Date   ALT 32 04/07/2020   AST 25 04/07/2020   ALKPHOS 76 04/07/2020   BILITOT 0.4 04/07/2020   No results found for: 25OHVITD2, 25OHVITD3, VD25OH   Review of Systems  Constitutional:  Negative for chills, fatigue and fever.  HENT:  Negative for congestion, hearing loss, tinnitus, trouble swallowing and voice change.   Eyes:  Negative for visual disturbance.  Respiratory:  Negative for cough, chest tightness, shortness of breath and wheezing.   Cardiovascular:  Negative for chest pain, palpitations and leg swelling.  Gastrointestinal:  Negative for abdominal pain, constipation, diarrhea and vomiting.  Endocrine: Negative for polydipsia and polyuria.  Genitourinary:  Negative for dysuria, frequency, genital sores, vaginal bleeding and vaginal discharge.  Musculoskeletal:  Negative for arthralgias, gait problem and joint swelling.  Skin:  Negative for color change and rash.  Neurological:  Negative for dizziness, tremors, light-headedness and headaches.  Hematological:  Negative for adenopathy. Does not bruise/bleed easily.  Psychiatric/Behavioral:  Positive for depression. Negative for dysphoric mood and sleep disturbance. The patient has insomnia. The patient is not nervous/anxious.    Patient Active Problem List   Diagnosis Date Noted   BMI 35.0-35.9,adult 12/07/2019   Depression, major, single episode, complete remission (HCC) 11/04/2018   Atypical chest pain 07/04/2015   Prediabetes 11/16/2014   Adjustment disorder with anxiety  04/07/2009   Hypercholesteremia 12/23/2008   Allergic rhinitis 11/16/2008   Primary insomnia 08/18/2008    No Known Allergies  Past Surgical History:  Procedure Laterality Date   CHOLECYSTECTOMY      Social History   Tobacco Use   Smoking status: Former    Types: Cigarettes     Quit date: 02/24/1994    Years since quitting: 27.1   Smokeless tobacco: Never  Vaping Use   Vaping Use: Never used  Substance Use Topics   Alcohol use: Yes    Comment: Rarely   Drug use: No     Medication list has been reviewed and updated.  Current Meds  Medication Sig   escitalopram (LEXAPRO) 20 MG tablet TAKE 1 TABLET(20 MG) BY MOUTH DAILY   Multiple Vitamin (MULTI-VITAMIN PO) Take by mouth daily.   pravastatin (PRAVACHOL) 40 MG tablet TAKE 1 TABLET(40 MG) BY MOUTH DAILY   UNABLE TO FIND Med Name: klova - for sleep   zaleplon (SONATA) 10 MG capsule TAKE 1 CAPSULE(10 MG) BY MOUTH AT BEDTIME AS NEEDED FOR SLEEP    PHQ 2/9 Scores 03/29/2021 03/30/2020 12/07/2019 11/04/2018  PHQ - 2 Score 1 0 2 2  PHQ- 9 Score 9 0 7 8    GAD 7 : Generalized Anxiety Score 03/29/2021 03/30/2020 12/07/2019 05/09/2016  Nervous, Anxious, on Edge 1 0 1 3  Control/stop worrying 1 0 0 1  Worry too much - different things 1 0 0 0  Trouble relaxing 0 0 2 0  Restless 0 0 0 0  Easily annoyed or irritable 3 0 2 3  Afraid - awful might happen 0 0 0 0  Total GAD 7 Score 6 0 5 7  Anxiety Difficulty - - Not difficult at all Very difficult    BP Readings from Last 3 Encounters:  03/29/21 104/72  03/30/20 116/74  12/07/19 118/78    Physical Exam Vitals and nursing note reviewed.  Constitutional:      General: She is not in acute distress.    Appearance: She is well-developed.  HENT:     Head: Normocephalic and atraumatic.     Right Ear: Tympanic membrane and ear canal normal.     Left Ear: Tympanic membrane and ear canal normal.     Nose:     Right Sinus: No maxillary sinus tenderness.     Left Sinus: No maxillary sinus tenderness.  Eyes:     General: No scleral icterus.       Right eye: No discharge.        Left eye: No discharge.     Conjunctiva/sclera: Conjunctivae normal.  Neck:     Thyroid: No thyromegaly.     Vascular: No carotid bruit.  Cardiovascular:     Rate and Rhythm: Normal rate  and regular rhythm.     Pulses: Normal pulses.     Heart sounds: Normal heart sounds.  Pulmonary:     Effort: Pulmonary effort is normal. No respiratory distress.     Breath sounds: No wheezing.  Chest:     Comments: Breast exam declined due to recent mammogram. Abdominal:     General: Bowel sounds are normal.     Palpations: Abdomen is soft.     Tenderness: There is no abdominal tenderness.  Musculoskeletal:     Cervical back: Normal range of motion. No erythema.     Right lower leg: No edema.     Left lower leg: No edema.  Lymphadenopathy:  Cervical: No cervical adenopathy.  Skin:    General: Skin is warm and dry.     Findings: No rash.  Neurological:     Mental Status: She is alert and oriented to person, place, and time.     Cranial Nerves: No cranial nerve deficit.     Sensory: No sensory deficit.     Deep Tendon Reflexes: Reflexes are normal and symmetric.  Psychiatric:        Attention and Perception: Attention normal.        Mood and Affect: Mood normal.        Behavior: Behavior normal.    Wt Readings from Last 3 Encounters:  03/29/21 190 lb (86.2 kg)  03/30/20 195 lb (88.5 kg)  12/07/19 201 lb (91.2 kg)    BP 104/72    Pulse 90    Ht 5\' 3"  (1.6 m)    Wt 190 lb (86.2 kg)    LMP 12/12/2016    SpO2 95%    BMI 33.66 kg/m   Assessment and Plan: 1. Annual physical exam Exam is normal except for weight. Encourage regular exercise and appropriate dietary changes. Continue healthy diet. - CBC with Differential/Platelet - Comprehensive metabolic panel - VITAMIN D 25 Hydroxy (Vit-D Deficiency, Fractures)  2. Encounter for screening for cervical cancer Obtained today. - Cytology - PAP  3. Hypercholesteremia Tolerating statin medication without side effects at this time LDL is at goal of < 70 on current dose Continue same therapy without change at this time. - Lipid panel - pravastatin (PRAVACHOL) 40 MG tablet; TAKE 1 TABLET(40 MG) BY MOUTH DAILY  Dispense:  90 tablet; Refill: 3  4. Prediabetes Continue healthy diet - Hemoglobin A1c  5. Depression, major, single episode, complete remission (HCC) Clinically stable on current regimen with good control of symptoms, No SI or HI. Will continue current therapy. - TSH - escitalopram (LEXAPRO) 20 MG tablet; TAKE 1 TABLET(20 MG) BY MOUTH DAILY  Dispense: 90 tablet; Refill: 3    Partially dictated using Editor, commissioning. Any errors are unintentional.  Halina Maidens, MD [facility]  03/29/2021

## 2021-03-30 LAB — CBC WITH DIFFERENTIAL/PLATELET
Basophils Absolute: 0 10*3/uL (ref 0.0–0.2)
Basos: 0 %
EOS (ABSOLUTE): 0.1 10*3/uL (ref 0.0–0.4)
Eos: 2 %
Hematocrit: 43.2 % (ref 34.0–46.6)
Hemoglobin: 14.7 g/dL (ref 11.1–15.9)
Immature Grans (Abs): 0 10*3/uL (ref 0.0–0.1)
Immature Granulocytes: 0 %
Lymphocytes Absolute: 1.6 10*3/uL (ref 0.7–3.1)
Lymphs: 25 %
MCH: 29.8 pg (ref 26.6–33.0)
MCHC: 34 g/dL (ref 31.5–35.7)
MCV: 88 fL (ref 79–97)
Monocytes Absolute: 0.6 10*3/uL (ref 0.1–0.9)
Monocytes: 9 %
Neutrophils Absolute: 4 10*3/uL (ref 1.4–7.0)
Neutrophils: 64 %
Platelets: 198 10*3/uL (ref 150–450)
RBC: 4.93 x10E6/uL (ref 3.77–5.28)
RDW: 12.9 % (ref 11.7–15.4)
WBC: 6.2 10*3/uL (ref 3.4–10.8)

## 2021-03-30 LAB — COMPREHENSIVE METABOLIC PANEL
ALT: 36 IU/L — ABNORMAL HIGH (ref 0–32)
AST: 26 IU/L (ref 0–40)
Albumin/Globulin Ratio: 2 (ref 1.2–2.2)
Albumin: 4.7 g/dL (ref 3.8–4.9)
Alkaline Phosphatase: 68 IU/L (ref 44–121)
BUN/Creatinine Ratio: 17 (ref 9–23)
BUN: 12 mg/dL (ref 6–24)
Bilirubin Total: 0.4 mg/dL (ref 0.0–1.2)
CO2: 22 mmol/L (ref 20–29)
Calcium: 9.8 mg/dL (ref 8.7–10.2)
Chloride: 102 mmol/L (ref 96–106)
Creatinine, Ser: 0.72 mg/dL (ref 0.57–1.00)
Globulin, Total: 2.4 g/dL (ref 1.5–4.5)
Glucose: 109 mg/dL — ABNORMAL HIGH (ref 70–99)
Potassium: 4.4 mmol/L (ref 3.5–5.2)
Sodium: 138 mmol/L (ref 134–144)
Total Protein: 7.1 g/dL (ref 6.0–8.5)
eGFR: 97 mL/min/{1.73_m2} (ref >59)

## 2021-03-30 LAB — LIPID PANEL
Chol/HDL Ratio: 4.4 ratio (ref 0.0–4.4)
Cholesterol, Total: 242 mg/dL — ABNORMAL HIGH (ref 100–199)
HDL: 55 mg/dL (ref >39)
LDL Chol Calc (NIH): 158 mg/dL — ABNORMAL HIGH (ref 0–99)
Triglycerides: 162 mg/dL — ABNORMAL HIGH (ref 0–149)
VLDL Cholesterol Cal: 29 mg/dL (ref 5–40)

## 2021-03-30 LAB — VITAMIN D 25 HYDROXY (VIT D DEFICIENCY, FRACTURES): Vit D, 25-Hydroxy: 23.3 ng/mL — ABNORMAL LOW (ref 30.0–100.0)

## 2021-03-30 LAB — HEMOGLOBIN A1C
Est. average glucose Bld gHb Est-mCnc: 123 mg/dL
Hgb A1c MFr Bld: 5.9 % — ABNORMAL HIGH (ref 4.8–5.6)

## 2021-03-30 LAB — TSH: TSH: 2.51 u[IU]/mL (ref 0.450–4.500)

## 2021-04-02 LAB — CYTOLOGY - PAP
Comment: NEGATIVE
Diagnosis: NEGATIVE
High risk HPV: NEGATIVE

## 2021-04-10 ENCOUNTER — Other Ambulatory Visit: Payer: Self-pay | Admitting: Internal Medicine

## 2021-04-10 DIAGNOSIS — F325 Major depressive disorder, single episode, in full remission: Secondary | ICD-10-CM

## 2021-04-10 NOTE — Telephone Encounter (Signed)
Refilled 03/29/2021 #90 with 3 refills - 1 year supply.  Requested Prescriptions  Pending Prescriptions Disp Refills   escitalopram (LEXAPRO) 20 MG tablet [Pharmacy Med Name: ESCITALOPRAM 20MG  TABLETS] 90 tablet 3    Sig: TAKE 1 TABLET(20 MG) BY MOUTH DAILY     Psychiatry:  Antidepressants - SSRI Passed - 04/10/2021  3:36 AM      Passed - Completed PHQ-2 or PHQ-9 in the last 360 days      Passed - Valid encounter within last 6 months    Recent Outpatient Visits          1 week ago Annual physical exam   [facility], MD   1 year ago Prediabetes   [facility], MD   1 year ago Depression, major, recurrent, mild [facility])   [facility], MD   2 years ago Hypercholesteremia   [facility] [place], [place] M, M   3 years ago Obesity (BMI 30.0-34.9)   [facility] [place], [place], Molly Maduro      Future Appointments            In 11 months [place], Judithann Graves, MD [facility], [facility]

## 2021-06-08 ENCOUNTER — Telehealth: Payer: Managed Care, Other (non HMO) | Admitting: Emergency Medicine

## 2021-06-08 DIAGNOSIS — J329 Chronic sinusitis, unspecified: Secondary | ICD-10-CM | POA: Diagnosis not present

## 2021-06-08 MED ORDER — AMOXICILLIN-POT CLAVULANATE 875-125 MG PO TABS: 1.0000 | ORAL_TABLET | Freq: Two times a day (BID) | ORAL | 0 refills | Status: DC

## 2021-06-08 MED ORDER — SALINE SPRAY 0.65 % NA SOLN: 1.0000 | NASAL | 0 refills | Status: AC | PRN

## 2021-06-08 NOTE — Patient Instructions (Signed)
This is likely viral.  In the absence of fever and with symptoms having only lasted 5 days, it's unlikely that antibiotics will be beneficial for you.  I recommend that you hold off on taking the antibiotic for another 2-3 days and see if you start to improve to limit antibiotic resistance. ?

## 2021-06-08 NOTE — Progress Notes (Signed)
?Virtual Visit Consent  ? ?Rebecca Greer, you are scheduled for a virtual visit with a [facility] provider today.   ?  ?Just as with appointments in the office, your consent must be obtained to participate.  Your consent will be active for this visit and any virtual visit you may have with one of our providers in the next 365 days.   ?  ?If you have a MyChart account, a copy of this consent can be sent to you electronically.  All virtual visits are billed to your insurance company just like a traditional visit in the office.   ? ?As this is a virtual visit, video technology does not allow for your provider to perform a traditional examination.  This may limit your provider's ability to fully assess your condition.  If your provider identifies any concerns that need to be evaluated in person or the need to arrange testing (such as labs, EKG, etc.), we will make arrangements to do so.   ?  ?Although advances in technology are sophisticated, we cannot ensure that it will always work on either your end or our end.  If the connection with a video visit is poor, the visit may have to be switched to a telephone visit.  With either a video or telephone visit, we are not always able to ensure that we have a secure connection.    ? ?I need to obtain your verbal consent now.   Are you willing to proceed with your visit today?  ?  ?Rebecca Greer has provided verbal consent on 06/08/2021 for a virtual visit (video or telephone). ?  ?Rebecca Horseman, PA-C  ? ?Date: 06/08/2021 10:17 AM ? ? ?Virtual Visit via Video Note  ? ?IRoxy Greer, connected with  Rebecca Greer  ([id number], [birth year]) on 06/08/21 at 10:15 AM EDT by a video-enabled telemedicine application and verified that I am speaking with the correct person using two identifiers. ? ?Location: ?Patient: Virtual Visit Location Patient: Home ?Provider: Virtual Visit Location Provider: Home Office ?  ?I discussed the limitations of evaluation and management by  telemedicine and the availability of in person appointments. The patient expressed understanding and agreed to proceed.   ? ?History of Present Illness: ?Rebecca Greer is a 61 y.o. who identifies as a female who was assigned female at birth, and is being seen today for sinus infection.  States that the symptoms started about 5 days ago.  States that she doesn't have them very often. States that she has a lot of sinus congestion and pressure.  States the discharge is yellow. ? ?HPI: HPI  ?Problems:  ?Patient Active Problem List  ? Diagnosis Date Noted  ? BMI 35.0-35.9,adult 12/07/2019  ? Depression, major, single episode, complete remission (HCC) 11/04/2018  ? Atypical chest pain 07/04/2015  ? Prediabetes 11/16/2014  ? Adjustment disorder with anxiety 04/07/2009  ? Hypercholesteremia 12/23/2008  ? Allergic rhinitis 11/16/2008  ? Primary insomnia 08/18/2008  ?  ?Allergies: No Known Allergies ?Medications:  ?Current Outpatient Medications:  ?  escitalopram (LEXAPRO) 20 MG tablet, TAKE 1 TABLET(20 MG) BY MOUTH DAILY, Disp: 90 tablet, Rfl: 3 ?  Multiple Vitamin (MULTI-VITAMIN PO), Take by mouth daily., Disp: , Rfl:  ?  pravastatin (PRAVACHOL) 40 MG tablet, TAKE 1 TABLET(40 MG) BY MOUTH DAILY, Disp: 90 tablet, Rfl: 3 ?  UNABLE TO FIND, Med Name: klova - for sleep, Disp: , Rfl:  ?  zaleplon (SONATA) 10 MG capsule, TAKE 1 CAPSULE(10 MG) BY  MOUTH AT BEDTIME AS NEEDED FOR SLEEP, Disp: 90 capsule, Rfl: 1 ? ?Observations/Objective: ?Patient is well-developed, well-nourished in no acute distress.  ?Resting comfortably at home.  ?Head is normocephalic, atraumatic.  ?No labored breathing.  ?Speech is clear and coherent with logical content.  ?Patient is alert and oriented at baseline.  ? ? ?Assessment and Plan: ?1. Sinusitis, unspecified chronicity, unspecified location ? ?On day 5 of sinus congestion.  We discussed that antibiotics are likely not indicated.  Given that we are going in to the weekend and patient won't be able  to get in to see her PCP, I'll send Augmentin, but have encouraged patient to give it a few more days and to try nasal saline to see if symptoms start to clear as I suspect this is viral. ? ?Follow Up Instructions: ?I discussed the assessment and treatment plan with the patient. The patient was provided an opportunity to ask questions and all were answered. The patient agreed with the plan and demonstrated an understanding of the instructions.  A copy of instructions were sent to the patient via MyChart unless otherwise noted below.  ? ? ? ?The patient was advised to call back or seek an in-person evaluation if the symptoms worsen or if the condition fails to improve as anticipated. ? ?Time:  ?I spent 11 minutes with the patient via telehealth technology discussing the above problems/concerns.   ? ?Rebecca Horseman, PA-C ? ?

## 2021-08-16 ENCOUNTER — Other Ambulatory Visit: Payer: Self-pay | Admitting: Internal Medicine

## 2021-08-16 DIAGNOSIS — F5101 Primary insomnia: Secondary | ICD-10-CM

## 2021-11-18 ENCOUNTER — Other Ambulatory Visit: Payer: Self-pay | Admitting: Internal Medicine

## 2021-11-18 DIAGNOSIS — F5101 Primary insomnia: Secondary | ICD-10-CM

## 2021-11-19 ENCOUNTER — Other Ambulatory Visit: Payer: Self-pay | Admitting: Internal Medicine

## 2021-11-19 NOTE — Telephone Encounter (Signed)
Please review. Last office visit 03/29/21.  KP

## 2021-11-19 NOTE — Telephone Encounter (Signed)
Requested medications are due for refill today.  yes  Requested medications are on the active medications list.  yes  Last refill. 08/17/2021 #90 0 rf  Future visit scheduled.   yes  Notes to clinic.  Medication refill is not delegated.    Requested Prescriptions  Pending Prescriptions Disp Refills   zaleplon (SONATA) 10 MG capsule [Pharmacy Med Name: ZALEPLON 10MG  CAPSULES] 90 capsule     Sig: TAKE 1 CAPSULE(10 MG) BY MOUTH AT BEDTIME AS NEEDED FOR SLEEP     Not Delegated - Psychiatry:  Anxiolytics/Hypnotics Failed - 11/18/2021  8:26 PM      Failed - This refill cannot be delegated      Failed - Urine Drug Screen completed in last 360 days      Failed - Valid encounter within last 6 months    Recent Outpatient Visits           7 months ago Annual physical exam   [facility], Jesse Sans, MD   1 year ago Prediabetes   [facility], Jesse Sans, MD   1 year ago Depression, major, recurrent, mild [facility])   [facility], Jesse Sans, MD   3 years ago [place], [place], [place]   3 years ago Obesity (BMI 30.0-34.Davis)   [place], [place]       Future Appointments             In 4 months [place], Jesse Sans, MD [facility] at [facility], [facility]

## 2022-01-13 ENCOUNTER — Emergency Department
Admission: EM | Admit: 2022-01-13 | Discharge: 2022-01-13 | Disposition: A | Discharge status: Home / Self Care | Payer: Managed Care, Other (non HMO) | Attending: Emergency Medicine | Admitting: Emergency Medicine

## 2022-01-13 ENCOUNTER — Encounter: Payer: Self-pay | Admitting: Emergency Medicine

## 2022-01-13 ENCOUNTER — Other Ambulatory Visit: Payer: Self-pay

## 2022-01-13 ENCOUNTER — Emergency Department: Payer: Managed Care, Other (non HMO)

## 2022-01-13 DIAGNOSIS — Z23 Encounter for immunization: Secondary | ICD-10-CM | POA: Insufficient documentation

## 2022-01-13 DIAGNOSIS — W540XXA Bitten by dog, initial encounter: Secondary | ICD-10-CM | POA: Diagnosis not present

## 2022-01-13 DIAGNOSIS — S61451A Open bite of right hand, initial encounter: Secondary | ICD-10-CM | POA: Insufficient documentation

## 2022-01-13 MED ORDER — AMOXICILLIN-POT CLAVULANATE 875-125 MG PO TABS: 1.0000 | ORAL_TABLET | Freq: Two times a day (BID) | ORAL | 0 refills | Status: AC

## 2022-01-13 MED ORDER — TETANUS-DIPHTH-ACELL PERTUSSIS 5-2.5-18.5 LF-MCG/0.5 IM SUSY
0.5000 mL | PREFILLED_SYRINGE | Freq: Once | INTRAMUSCULAR | Status: AC
  Administered 2022-01-13: 0.5 mL via INTRAMUSCULAR
  Filled 2022-01-13: qty 0.5

## 2022-01-13 NOTE — ED Provider Notes (Signed)
[facility] Provider Note    Event Date/Time   First MD Initiated Contact with Patient 01/13/22 1439     (approximate)   History   Animal Bite (/)   HPI  Rebecca Greer is a 60 y.o. female with a past medical history of hypercholesterolemia, prediabetes, depression who presents today for evaluation of dog bite to her right hand that occurred yesterday.  Patient reports that she went to the dog park and her dog was attacked by a pit bull mix at the dog park.  Patient reports and she was trying to break up the fight she was bit on her right hand.  The paramedics washed her hand out at the scene, and then patient continue to wash her hand out at home.  She was instructed to come to the emergency department for evaluation and antibiotics.  Patient reports that she has mild discomfort to the top of her hand but is able to range her hand completely.  No fevers or chills.  She is unsure of her last tetanus shot.  Patient Active Problem List   Diagnosis Date Noted   BMI 35.0-35.9,adult 12/07/2019   Depression, major, single episode, complete remission (HCC) 11/04/2018   Atypical chest pain 07/04/2015   Prediabetes 11/16/2014   Adjustment disorder with anxiety 04/07/2009   Hypercholesteremia 12/23/2008   Allergic rhinitis 11/16/2008   Primary insomnia 08/18/2008          Physical Exam   Triage Vital Signs: ED Triage Vitals  Enc Vitals Group     BP 01/13/22 1402 (!) 134/92     Pulse Rate 01/13/22 1402 90     Resp 01/13/22 1402 18     Temp 01/13/22 1402 98.3 F (36.8 C)     Temp Source 01/13/22 1402 Oral     SpO2 01/13/22 1402 94 %     Weight 01/13/22 1403 189 lb (85.7 kg)     Height 01/13/22 1403 5\' 3"  (1.6 m)     Head Circumference --      Peak Flow --      Pain Score 01/13/22 1403 3     Pain Loc --      Pain Edu? --      Excl. in GC? --     Most recent vital signs: Vitals:   01/13/22 1402 01/13/22 1519  BP: (!) 134/92 (!) 147/102  Pulse:  90 88  Resp: 18 18  Temp: 98.3 F (36.8 C) 98.2 F (36.8 C)  SpO2: 94% 98%    Physical Exam Vitals and nursing note reviewed.  Constitutional:      General: Awake and alert. No acute distress.    Appearance: Normal appearance. The patient is normal weight.  HENT:     Head: Normocephalic and atraumatic.     Mouth: Mucous membranes are moist.  Eyes:     General: PERRL. Normal EOMs        Right eye: No discharge.        Left eye: No discharge.     Conjunctiva/sclera: Conjunctivae normal.  Cardiovascular:     Rate and Rhythm: Normal rate and regular rhythm.     Pulses: Normal pulses.  Pulmonary:     Effort: Pulmonary effort is normal. No respiratory distress.  Abdominal:     Abdomen is soft. There is no abdominal tenderness. No rebound or guarding. No distention. Musculoskeletal:        General: No swelling. Normal range of motion.  Cervical back: Normal range of motion and neck supple.  Right hand: Superficial swelling noted to the dorsum of the hand with a 1.5 cm scab at the top of the hand, as well as a 2 mm scab to her thumb.  She is able to range her hand fully and normally.  Normal grip strength.  Normal intrinsic muscle function of the hand.  No snuffbox tenderness.  Normal radial pulse. Skin:    General: Skin is warm and dry.     Capillary Refill: Capillary refill takes less than 2 seconds.     Findings: No rash.  Neurological:     Mental Status: The patient is awake and alert.      ED Results / Procedures / Treatments   Labs (all labs ordered are listed, but only abnormal results are displayed) Labs Reviewed - No data to display   EKG     RADIOLOGY I independently reviewed and interpreted imaging and agree with radiologists findings.     PROCEDURES:  Critical Care performed:   Procedures   MEDICATIONS ORDERED IN ED: Medications  Tdap (BOOSTRIX) injection 0.5 mL (0.5 mLs Intramuscular Given 01/13/22 1505)     IMPRESSION / MDM / ASSESSMENT  AND PLAN / ED COURSE  I reviewed the triage vital signs and the nursing notes.   Differential diagnosis includes, but is not limited to, fracture, contusion, laceration.  Patient is awake and alert, hemodynamically stable and afebrile.  She is neurovascularly intact.  Normal intrinsic muscle function of her hand.  She has a scab to the location where she was bit, no surrounding erythema or lymphangitis.  No active drainage.  Patient was given an updated tetanus shot.  Her x-ray is negative for any acute osseous injury or retained foreign body.  She was started on Augmentin.  We discussed rabies vaccination, however she reports that animal control has located the dog and contacted the owner, and she will find out the vaccination status tomorrow and she wishes to wait until this time.  She understands the timeframe for which she must receive the rabies vaccination.  Discussed return precautions and the importance of close outpatient follow-up.  Patient understands and agrees with plan.  Discharged in stable condition.   Patient's presentation is most consistent with acute complicated illness / injury requiring diagnostic workup.      FINAL CLINICAL IMPRESSION(S) / ED DIAGNOSES   Final diagnoses:  Dog bite of right hand, initial encounter     Rx / DC Orders   ED Discharge Orders          Ordered    amoxicillin-clavulanate (AUGMENTIN) 875-125 MG tablet  2 times daily        01/13/22 1507             Note:  This document was prepared using Dragon voice recognition software and may include unintentional dictation errors.   Keturah Shavers 01/13/22 1530    Georga Hacking, MD 01/14/22 2328

## 2022-01-13 NOTE — ED Triage Notes (Signed)
Pt via POV from home. Pt c/o dog bite to the R hand, unknown the vaccination status of the dog. Police report already filed. Pt is A&Ox4 and NAD

## 2022-01-13 NOTE — ED Notes (Signed)
PT states yesterday she was bit by a dog to the right hand. Pt states she filed a police report. Right hand noted to be swollen, have redness. Capillary refill less than 3 seconds to the fingers. Small puncture wounds noted to the hand, with dried blood/scabs starting to form. Pt educated to keep clean and dry

## 2022-01-13 NOTE — Discharge Instructions (Signed)
Your x-ray was negative.  Keep the wound clean with soap and water, wash multiple times per day.  He did not wish to have rabies vaccinations today.  Your tetanus vaccination was updated.  Please take the antibiotics as prescribed.  Return for any new, worsening, or change in symptoms or other concerns.

## 2022-01-14 ENCOUNTER — Telehealth: Payer: Self-pay | Admitting: Internal Medicine

## 2022-01-14 NOTE — Telephone Encounter (Signed)
Patient informed and agreed to plan.  CMcAdoo

## 2022-01-14 NOTE — Telephone Encounter (Signed)
Copied from CRM [id number]. Topic: General - Other >> Jan 14, 2022 11:48 AM Pincus Sanes wrote: Reason for CRM: Pt need fu as got attacked, her a and her dog this weekend, treat at [facility], [facility] wnating to start Rabies shots, owner states had shots, no proof, pt does not want shots and wants to talk to dr FU at [phone]

## 2022-01-15 ENCOUNTER — Telehealth: Payer: Self-pay

## 2022-01-15 NOTE — Telephone Encounter (Signed)
Transition Care Management Unsuccessful Follow-up Telephone Call  Date of discharge and from where:  [facility] 01/13/2022  Attempts:  1st Attempt  Reason for unsuccessful TCM follow-up call:  Left voice message

## 2022-03-29 ENCOUNTER — Encounter: Payer: Managed Care, Other (non HMO) | Admitting: Internal Medicine

## 2022-04-01 ENCOUNTER — Encounter: Payer: Managed Care, Other (non HMO) | Admitting: Internal Medicine

## 2022-05-21 ENCOUNTER — Other Ambulatory Visit: Payer: Self-pay | Admitting: Internal Medicine

## 2022-05-21 DIAGNOSIS — F5101 Primary insomnia: Secondary | ICD-10-CM

## 2022-05-22 NOTE — Telephone Encounter (Signed)
Requested medication (s) are due for refill today: yes  Requested medication (s) are on the active medication list: yes  Last refill:  11/19/21  Future visit scheduled: yes  Notes to clinic:  Unable to refill per protocol, cannot delegate.      Requested Prescriptions  Pending Prescriptions Disp Refills   zaleplon (SONATA) 10 MG capsule [Pharmacy Med Name: ZALEPLON 10MG  CAPSULES] 90 capsule     Sig: TAKE 1 CAPSULE(10 MG) BY MOUTH AT BEDTIME AS NEEDED FOR SLEEP     Not Delegated - Psychiatry:  Anxiolytics/Hypnotics Failed - 05/21/2022  9:24 PM      Failed - This refill cannot be delegated      Failed - Urine Drug Screen completed in last 360 days      Failed - Valid encounter within last 6 months    Recent Outpatient Visits           1 year ago Annual physical exam   [place] [facility] at [facility], Jesse Sans, MD   2 years ago Prediabetes   [place] at [facility], Jesse Sans, MD   2 years ago Depression, major, recurrent, mild [facility])   [place] at [facility], Jesse Sans, MD   3 years ago [place] [facility], Fabio Bering M, [place]   4 years ago Obesity (BMI 30.0-34.9)   [place], [place]       Future Appointments             In 3 weeks Army Melia, Jesse Sans, MD [place] at [facility], [facility]

## 2022-06-17 ENCOUNTER — Ambulatory Visit (INDEPENDENT_AMBULATORY_CARE_PROVIDER_SITE_OTHER): Payer: Managed Care, Other (non HMO) | Admitting: Internal Medicine

## 2022-06-17 ENCOUNTER — Encounter: Payer: Self-pay | Admitting: Internal Medicine

## 2022-06-17 VITALS — BP 126/78 | HR 85 | Ht 63.0 in | Wt 194.2 lb

## 2022-06-17 DIAGNOSIS — Z1211 Encounter for screening for malignant neoplasm of colon: Secondary | ICD-10-CM

## 2022-06-17 DIAGNOSIS — F325 Major depressive disorder, single episode, in full remission: Secondary | ICD-10-CM

## 2022-06-17 DIAGNOSIS — Z1231 Encounter for screening mammogram for malignant neoplasm of breast: Secondary | ICD-10-CM

## 2022-06-17 DIAGNOSIS — Z0000 Encounter for general adult medical examination without abnormal findings: Secondary | ICD-10-CM

## 2022-06-17 DIAGNOSIS — E7800 Pure hypercholesterolemia, unspecified: Secondary | ICD-10-CM | POA: Diagnosis not present

## 2022-06-17 DIAGNOSIS — R7303 Prediabetes: Secondary | ICD-10-CM

## 2022-06-17 MED ORDER — PRAVASTATIN SODIUM 40 MG PO TABS: ORAL_TABLET | ORAL | 3 refills | Status: DC

## 2022-06-17 NOTE — Assessment & Plan Note (Addendum)
Clinically stable on current regimen with fair control of symptoms, No SI or HI. She is feeling unemotional and apathetic. Will reduce the dose to 10 mg Lexapro per day and call for Rx if doing well.

## 2022-06-17 NOTE — Patient Instructions (Signed)
Call [facility] to schedule your mammogram at [phone].  

## 2022-06-17 NOTE — Assessment & Plan Note (Signed)
Tolerating statin medications without concerns LDL is  Lab Results  Component Value Date   LDLCALC 158 (H) 03/29/2021   with a goal of < 70. Current dose will be adjusted if needed.

## 2022-06-17 NOTE — Progress Notes (Signed)
Date:  06/17/2022   Name:  Rebecca Greer   DOB:  [birth year]   MRN:  [id number]   Chief Complaint: Annual Exam Rebecca Greer is a 62 y.o. female who presents today for her Complete Annual Exam. She feels well. She reports exercising - none. She reports she is sleeping fairly well. Breast complaints - none.  Mammogram: 02/2021 DEXA: none Pap smear: 03/2021 neg/neg Colonoscopy: 11/2012 due this year  Health Maintenance Due  Topic Date Due   COVID-19 Vaccine (1) Never done   Zoster Vaccines- Shingrix (1 of 2) Never done   MAMMOGRAM  03/09/2022    Immunization History  Administered Date(s) Administered   Influenza,inj,Quad PF,6+ Mos 01/02/2015, 01/10/2017   Influenza-Unspecified 12/18/2015   Tdap 01/10/2017, 01/13/2022    Hyperlipidemia This is a chronic problem. The problem is controlled. Pertinent negatives include no chest pain or shortness of breath. Current antihyperlipidemic treatment includes statins.  Diabetes She presents for her follow-up diabetic visit. Diabetes type: prediabetes. Pertinent negatives for hypoglycemia include no dizziness, headaches, nervousness/anxiousness or tremors. Pertinent negatives for diabetes include no chest pain, no fatigue, no polydipsia and no polyuria.  Depression        This is a chronic problem.The problem is unchanged.  Associated symptoms include no fatigue and no headaches.  Past treatments include SSRIs - Selective serotonin reuptake inhibitors.  Compliance with treatment is good.   Lab Results  Component Value Date   NA 138 03/29/2021   K 4.4 03/29/2021   CO2 22 03/29/2021   GLUCOSE 109 (H) 03/29/2021   BUN 12 03/29/2021   CREATININE 0.72 03/29/2021   CALCIUM 9.8 03/29/2021   EGFR 97 03/29/2021   GFRNONAA 76 04/07/2020   Lab Results  Component Value Date   CHOL 242 (H) 03/29/2021   HDL 55 03/29/2021   LDLCALC 158 (H) 03/29/2021   TRIG 162 (H) 03/29/2021   CHOLHDL 4.4 03/29/2021   Lab Results  Component Value Date    TSH 2.510 03/29/2021   Lab Results  Component Value Date   HGBA1C 5.9 (H) 03/29/2021   Lab Results  Component Value Date   WBC 6.2 03/29/2021   HGB 14.7 03/29/2021   HCT 43.2 03/29/2021   MCV 88 03/29/2021   PLT 198 03/29/2021   Lab Results  Component Value Date   ALT 36 (H) 03/29/2021   AST 26 03/29/2021   ALKPHOS 68 03/29/2021   BILITOT 0.4 03/29/2021   Lab Results  Component Value Date   VD25OH 23.3 (L) 03/29/2021     Review of Systems  Constitutional:  Negative for chills, fatigue and fever.  HENT:  Negative for congestion, hearing loss, tinnitus, trouble swallowing and voice change.   Eyes:  Negative for visual disturbance.  Respiratory:  Negative for cough, chest tightness, shortness of breath and wheezing.   Cardiovascular:  Negative for chest pain, palpitations and leg swelling.  Gastrointestinal:  Negative for abdominal pain, constipation, diarrhea and vomiting.  Endocrine: Negative for polydipsia and polyuria.  Genitourinary:  Negative for dysuria, frequency, genital sores, vaginal bleeding and vaginal discharge.  Musculoskeletal:  Negative for arthralgias, gait problem and joint swelling.  Skin:  Negative for color change and rash.  Neurological:  Negative for dizziness, tremors, light-headedness and headaches.  Hematological:  Negative for adenopathy. Does not bruise/bleed easily.  Psychiatric/Behavioral:  Positive for depression. Negative for dysphoric mood and sleep disturbance. The patient is not nervous/anxious.     Patient Active Problem List   Diagnosis Date Noted  BMI 35.0-35.9,adult 12/07/2019   Depression, major, single episode, complete remission 11/04/2018   Atypical chest pain 07/04/2015   Prediabetes 11/16/2014   Adjustment disorder with anxiety 04/07/2009   Hypercholesteremia 12/23/2008   Allergic rhinitis 11/16/2008   Primary insomnia 08/18/2008    No Known Allergies  Past Surgical History:  Procedure Laterality Date    CHOLECYSTECTOMY      Social History   Tobacco Use   Smoking status: Former    Types: Cigarettes    Quit date: 02/24/1994    Years since quitting: 28.3   Smokeless tobacco: Never  Vaping Use   Vaping Use: Never used  Substance Use Topics   Alcohol use: Yes    Comment: Rarely   Drug use: No     Medication list has been reviewed and updated.  Current Meds  Medication Sig   escitalopram (LEXAPRO) 20 MG tablet TAKE 1 TABLET(20 MG) BY MOUTH DAILY   Multiple Vitamin (MULTI-VITAMIN PO) Take by mouth daily.   sodium chloride (OCEAN) 0.65 % SOLN nasal spray Place 1 spray into both nostrils as needed for congestion.   zaleplon (SONATA) 10 MG capsule TAKE 1 CAPSULE(10 MG) BY MOUTH AT BEDTIME AS NEEDED FOR SLEEP   [DISCONTINUED] pravastatin (PRAVACHOL) 40 MG tablet TAKE 1 TABLET(40 MG) BY MOUTH DAILY       06/17/2022   10:04 AM 03/29/2021    8:46 AM 03/30/2020    9:18 AM 12/07/2019    1:55 PM  GAD 7 : Generalized Anxiety Score  Nervous, Anxious, on Edge 1 1 0 1  Control/stop worrying 0 1 0 0  Worry too much - different things 0 1 0 0  Trouble relaxing 0 0 0 2  Restless 0 0 0 0  Easily annoyed or irritable 2 3 0 2  Afraid - awful might happen 0 0 0 0  Total GAD 7 Score 3 6 0 5  Anxiety Difficulty Not difficult at all   Not difficult at all       06/17/2022   10:04 AM 03/29/2021    8:45 AM 03/30/2020    9:17 AM  Depression screen PHQ 2/9  Decreased Interest 2 1 0  Down, Depressed, Hopeless 0 0 0  PHQ - 2 Score 2 1 0  Altered sleeping 0 1 0  Tired, decreased energy 2 3 0  Change in appetite 3 3 0  Feeling bad or failure about yourself  0 0 0  Trouble concentrating 1 1 0  Moving slowly or fidgety/restless 0 0 0  Suicidal thoughts 0 0 0  PHQ-9 Score 8 9 0  Difficult doing work/chores Not difficult at all Somewhat difficult     BP Readings from Last 3 Encounters:  06/17/22 126/78  01/13/22 (!) 147/102  03/29/21 104/72    Physical Exam Vitals and nursing note reviewed.   Constitutional:      General: She is not in acute distress.    Appearance: She is well-developed.  HENT:     Head: Normocephalic and atraumatic.     Right Ear: Tympanic membrane and ear canal normal.     Left Ear: Tympanic membrane and ear canal normal.     Nose:     Right Sinus: No maxillary sinus tenderness.     Left Sinus: No maxillary sinus tenderness.  Eyes:     General: No scleral icterus.       Right eye: No discharge.        Left eye: No discharge.  Conjunctiva/sclera: Conjunctivae normal.  Neck:     Thyroid: No thyromegaly.     Vascular: No carotid bruit.  Cardiovascular:     Rate and Rhythm: Normal rate and regular rhythm.     Pulses: Normal pulses.     Heart sounds: Normal heart sounds.  Pulmonary:     Effort: Pulmonary effort is normal. No respiratory distress.     Breath sounds: No wheezing.  Chest:  Breasts:    Right: No mass, nipple discharge, skin change or tenderness.     Left: No mass, nipple discharge, skin change or tenderness.  Abdominal:     General: Bowel sounds are normal.     Palpations: Abdomen is soft.     Tenderness: There is no abdominal tenderness.  Musculoskeletal:     Cervical back: Normal range of motion. No erythema.     Right lower leg: No edema.     Left lower leg: No edema.  Lymphadenopathy:     Cervical: No cervical adenopathy.  Skin:    General: Skin is warm and dry.     Findings: No rash.  Neurological:     Mental Status: She is alert and oriented to person, place, and time.     Cranial Nerves: No cranial nerve deficit.     Sensory: No sensory deficit.     Deep Tendon Reflexes: Reflexes are normal and symmetric.  Psychiatric:        Attention and Perception: Attention normal.        Mood and Affect: Mood normal.     Wt Readings from Last 3 Encounters:  06/17/22 194 lb 3.2 oz (88.1 kg)  01/13/22 189 lb (85.7 kg)  03/29/21 190 lb (86.2 kg)    BP 126/78   Pulse 85   Ht 5\' 3"  (1.6 m)   Wt 194 lb 3.2 oz (88.1 kg)    LMP 12/12/2016   SpO2 97%   BMI 34.40 kg/m   Assessment and Plan:  Problem List Items Addressed This Visit       Other   Depression, major, single episode, complete remission    Clinically stable on current regimen with fair control of symptoms, No SI or HI. She is feeling unemotional and apathetic. Will reduce the dose to 10 mg Lexapro per day and call for Rx if doing well.      Relevant Orders   TSH   Hypercholesteremia    Tolerating statin medications without concerns LDL is  Lab Results  Component Value Date   LDLCALC 158 (H) 03/29/2021  with a goal of < 70. Current dose will be adjusted if needed.       Relevant Medications   pravastatin (PRAVACHOL) 40 MG tablet   Other Relevant Orders   Lipid panel   Prediabetes    Currently treated with diet and exercise Lab Results  Component Value Date   HGBA1C 5.9 (H) 03/29/2021        Relevant Orders   Comprehensive metabolic panel   Hemoglobin A1c   Other Visit Diagnoses     Annual physical exam    -  Primary   Relevant Orders   CBC with Differential/Platelet   Comprehensive metabolic panel   Hemoglobin A1c   Lipid panel   TSH   Encounter for screening mammogram for breast cancer       Relevant Orders   MM 3D SCREENING MAMMOGRAM BILATERAL BREAST   Colon cancer screening       will obtain last colonoscopy report  form 2014 then refer to appropriate MD in [place].   Relevant Orders   Ambulatory referral to Gastroenterology       No follow-ups on file.   Partially dictated using Dragon software, any errors are not intentional.  Reubin Milan, MD [facility] [place], [place]

## 2022-06-17 NOTE — Assessment & Plan Note (Signed)
Currently treated with diet and exercise Lab Results  Component Value Date   HGBA1C 5.9 (H) 03/29/2021

## 2022-06-18 ENCOUNTER — Other Ambulatory Visit: Payer: Self-pay | Admitting: Internal Medicine

## 2022-06-18 DIAGNOSIS — E7800 Pure hypercholesterolemia, unspecified: Secondary | ICD-10-CM

## 2022-06-18 LAB — COMPREHENSIVE METABOLIC PANEL
ALT: 30 IU/L (ref 0–32)
AST: 19 IU/L (ref 0–40)
Albumin/Globulin Ratio: 1.7 (ref 1.2–2.2)
Albumin: 4.5 g/dL (ref 3.8–4.9)
Alkaline Phosphatase: 68 IU/L (ref 44–121)
BUN/Creatinine Ratio: 15 (ref 12–28)
BUN: 13 mg/dL (ref 8–27)
Bilirubin Total: 0.4 mg/dL (ref 0.0–1.2)
CO2: 21 mmol/L (ref 20–29)
Calcium: 9.5 mg/dL (ref 8.7–10.3)
Chloride: 105 mmol/L (ref 96–106)
Creatinine, Ser: 0.84 mg/dL (ref 0.57–1.00)
Globulin, Total: 2.6 g/dL (ref 1.5–4.5)
Glucose: 100 mg/dL — ABNORMAL HIGH (ref 70–99)
Potassium: 4.6 mmol/L (ref 3.5–5.2)
Sodium: 140 mmol/L (ref 134–144)
Total Protein: 7.1 g/dL (ref 6.0–8.5)
eGFR: 80 mL/min/{1.73_m2} (ref >59)

## 2022-06-18 LAB — CBC WITH DIFFERENTIAL/PLATELET
Basophils Absolute: 0 10*3/uL (ref 0.0–0.2)
Basos: 0 %
EOS (ABSOLUTE): 0.1 10*3/uL (ref 0.0–0.4)
Eos: 1 %
Hematocrit: 43.3 % (ref 34.0–46.6)
Hemoglobin: 14.4 g/dL (ref 11.1–15.9)
Immature Grans (Abs): 0 10*3/uL (ref 0.0–0.1)
Immature Granulocytes: 0 %
Lymphocytes Absolute: 1.7 10*3/uL (ref 0.7–3.1)
Lymphs: 29 %
MCH: 29 pg (ref 26.6–33.0)
MCHC: 33.3 g/dL (ref 31.5–35.7)
MCV: 87 fL (ref 79–97)
Monocytes Absolute: 0.5 10*3/uL (ref 0.1–0.9)
Monocytes: 8 %
Neutrophils Absolute: 3.4 10*3/uL (ref 1.4–7.0)
Neutrophils: 62 %
Platelets: 177 10*3/uL (ref 150–450)
RBC: 4.96 x10E6/uL (ref 3.77–5.28)
RDW: 13.1 % (ref 11.7–15.4)
WBC: 5.7 10*3/uL (ref 3.4–10.8)

## 2022-06-18 LAB — TSH: TSH: 2.75 u[IU]/mL (ref 0.450–4.500)

## 2022-06-18 LAB — LIPID PANEL
Chol/HDL Ratio: 5.4 ratio — ABNORMAL HIGH (ref 0.0–4.4)
Cholesterol, Total: 281 mg/dL — ABNORMAL HIGH (ref 100–199)
HDL: 52 mg/dL (ref >39)
LDL Chol Calc (NIH): 201 mg/dL — ABNORMAL HIGH (ref 0–99)
Triglycerides: 153 mg/dL — ABNORMAL HIGH (ref 0–149)
VLDL Cholesterol Cal: 28 mg/dL (ref 5–40)

## 2022-06-18 LAB — HEMOGLOBIN A1C
Est. average glucose Bld gHb Est-mCnc: 128 mg/dL
Hgb A1c MFr Bld: 6.1 % — ABNORMAL HIGH (ref 4.8–5.6)

## 2022-06-18 MED ORDER — ROSUVASTATIN CALCIUM 10 MG PO TABS: 10.0000 mg | ORAL_TABLET | Freq: Every day | ORAL | 0 refills | Status: DC

## 2022-06-19 NOTE — Progress Notes (Signed)
Called pt left VM that Rx was sent in. That she needs to schedule an appointment for 6 months she will get repeat labs at that time.  KP

## 2022-06-20 ENCOUNTER — Other Ambulatory Visit: Payer: Self-pay | Admitting: Internal Medicine

## 2022-06-20 DIAGNOSIS — F325 Major depressive disorder, single episode, in full remission: Secondary | ICD-10-CM

## 2022-06-20 DIAGNOSIS — E7800 Pure hypercholesterolemia, unspecified: Secondary | ICD-10-CM

## 2022-09-15 ENCOUNTER — Other Ambulatory Visit: Payer: Self-pay | Admitting: Internal Medicine

## 2022-09-15 DIAGNOSIS — E7800 Pure hypercholesterolemia, unspecified: Secondary | ICD-10-CM

## 2022-09-18 ENCOUNTER — Telehealth: Payer: Self-pay | Admitting: Internal Medicine

## 2022-09-18 NOTE — Telephone Encounter (Signed)
Left voice mail to set up follow appointment for fasting labs and cholesterol.

## 2022-11-18 ENCOUNTER — Other Ambulatory Visit: Payer: Self-pay | Admitting: Internal Medicine

## 2022-11-18 DIAGNOSIS — F5101 Primary insomnia: Secondary | ICD-10-CM

## 2022-11-24 IMAGING — MG MM DIGITAL SCREENING BILAT W/ TOMO AND CAD
8 series · 8 of 24 positions shown · non-contrast
Comparison: Previous exam(s).

CLINICAL DATA: Screening.

EXAM:
DIGITAL SCREENING BILATERAL MAMMOGRAM WITH TOMOSYNTHESIS AND CAD
TECHNIQUE: Bilateral screening digital craniocaudal and mediolateral oblique
mammograms were obtained. Bilateral screening digital breast
tomosynthesis was performed. The images were evaluated with
computer-aided detection.

[R CC synth-2D]
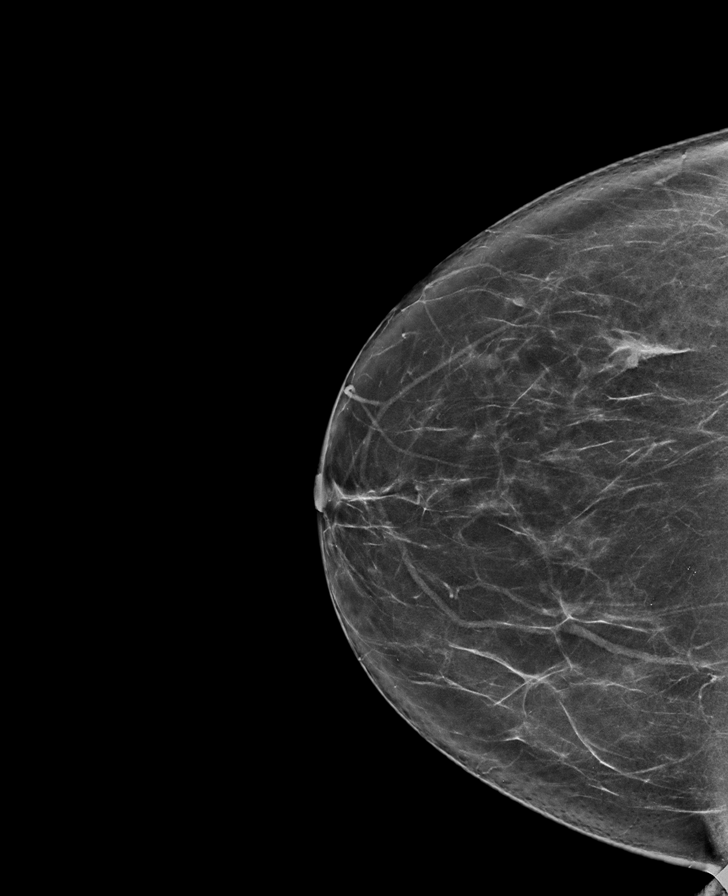

[L CC synth-2D]
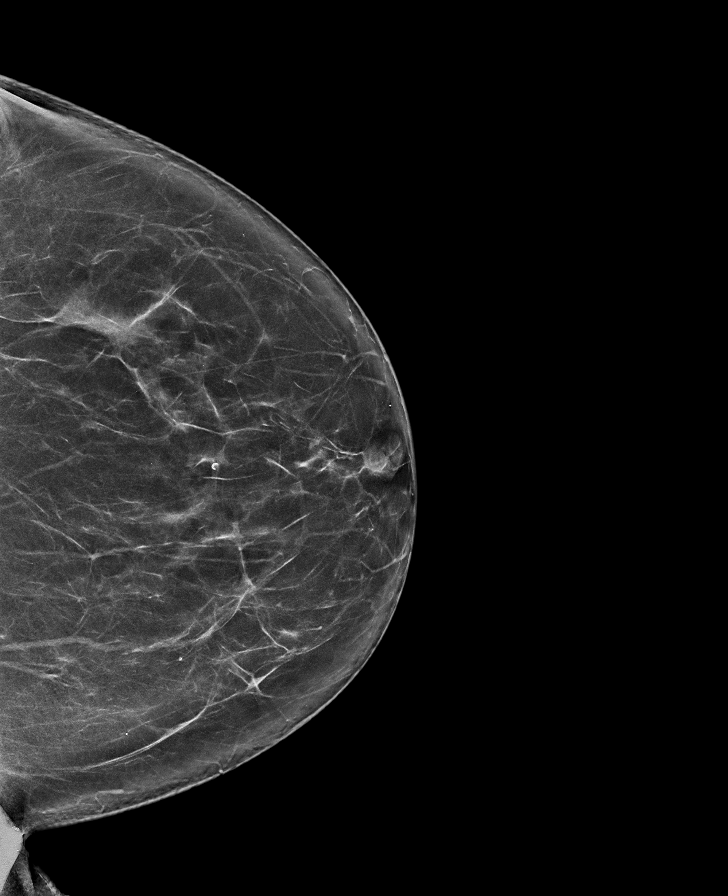

[L MLO synth-2D]
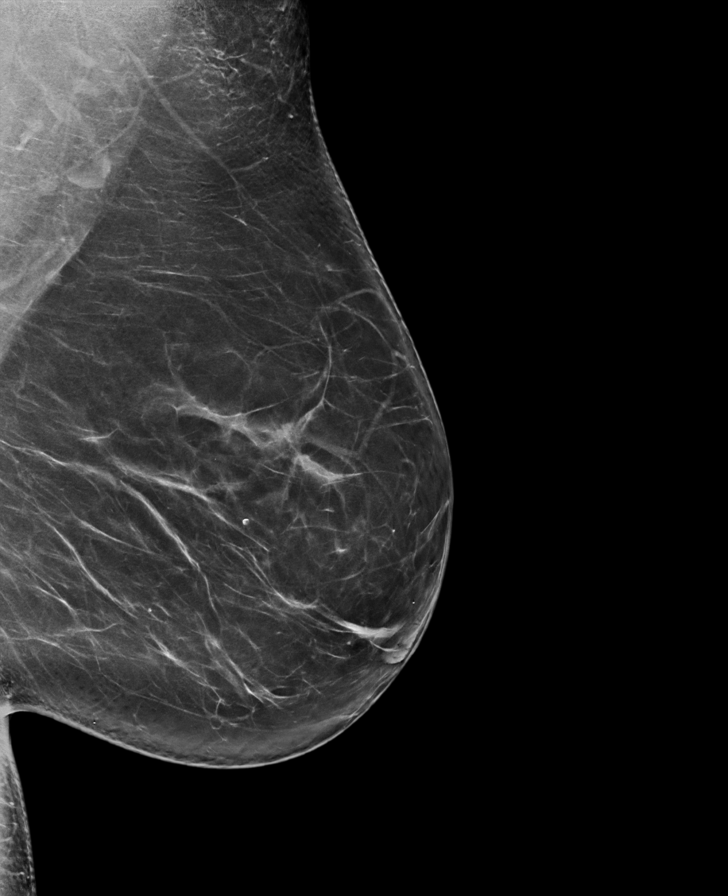

[R MLO synth-2D]
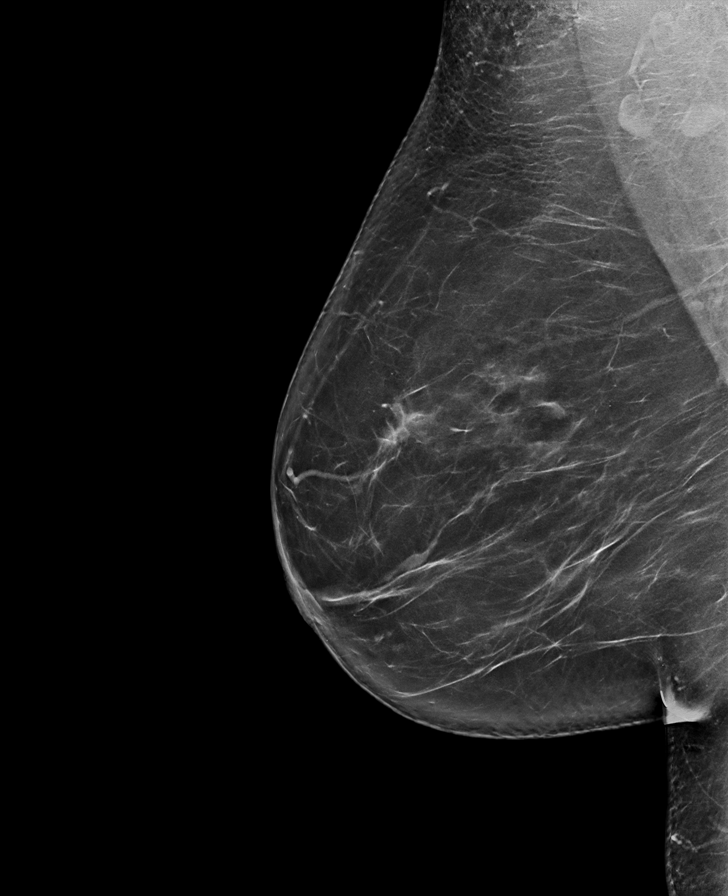

[R MLO tomo · tomo slice 46/91.0]
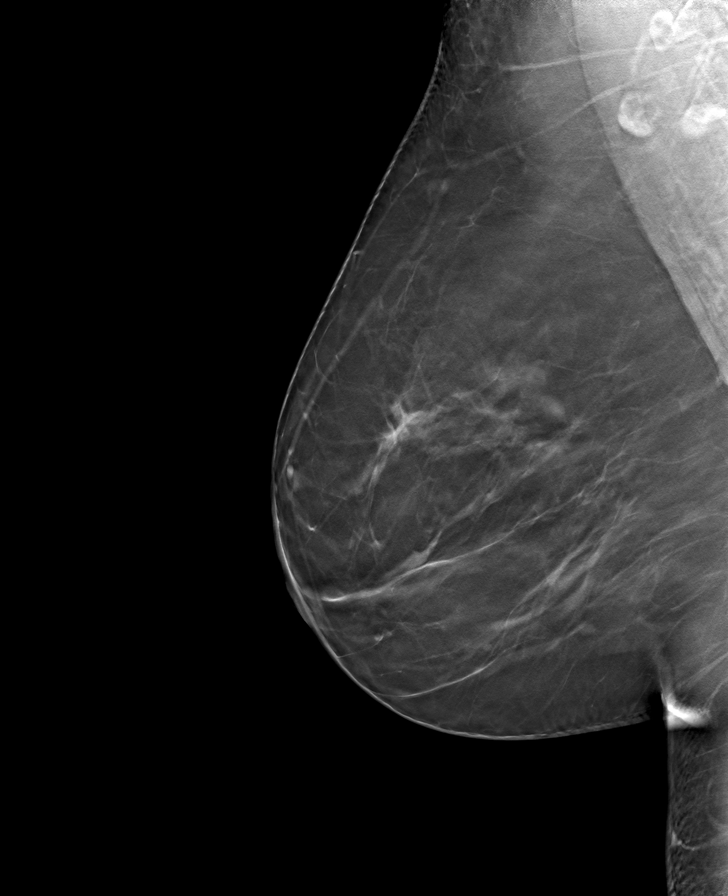

[L MLO tomo · tomo slice 45/88.0]
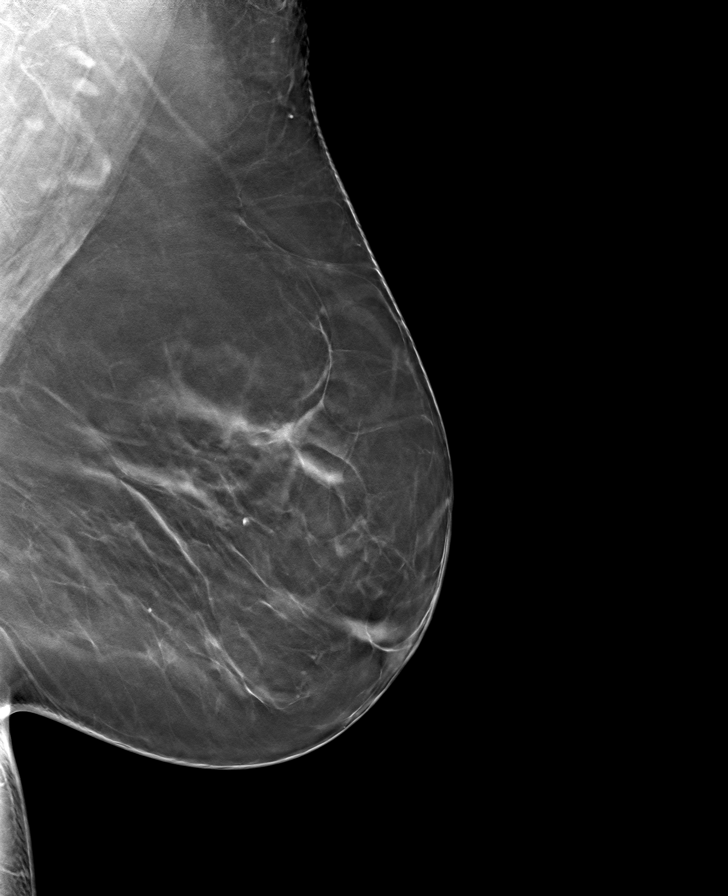

[L CC tomo · tomo slice 40/79.0]
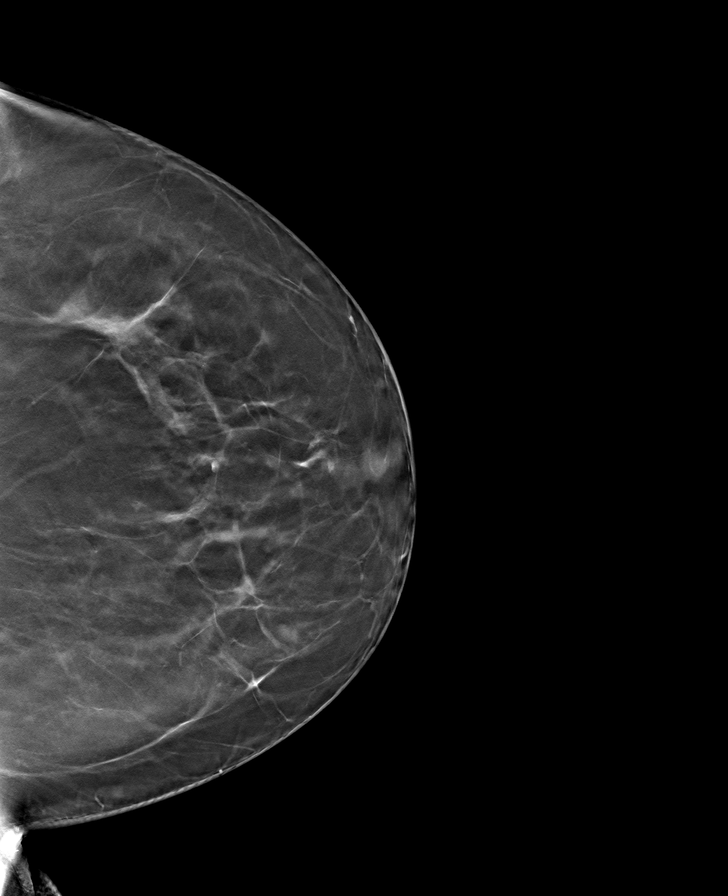

[R CC tomo · tomo slice 39/78.0]
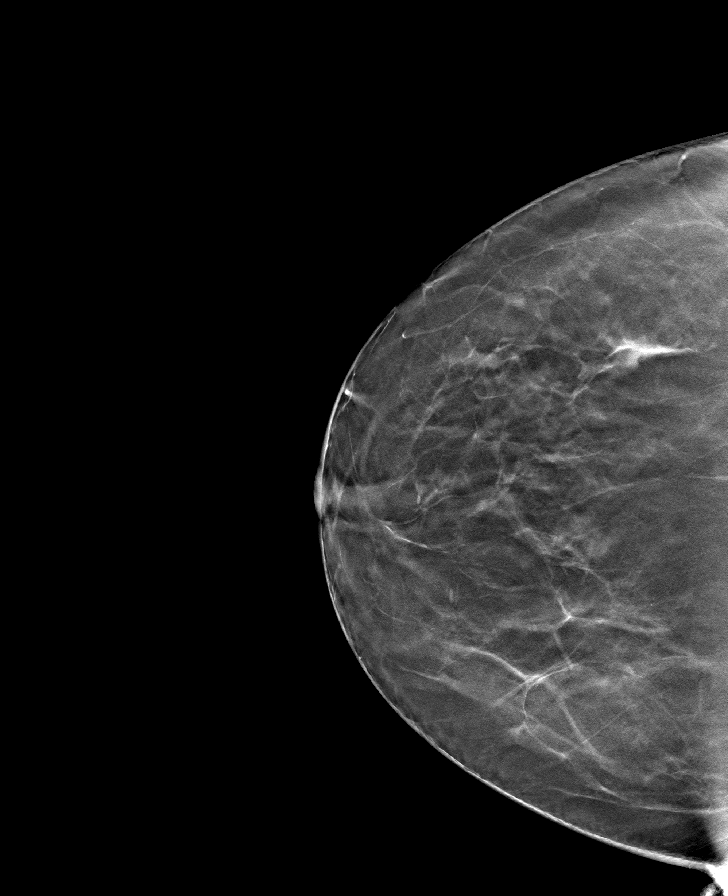

[8 of 24 positions shown; findings below may reference images not displayed]

ACR Breast Density Category b: There are scattered areas of
fibroglandular density.
FINDINGS: There are no findings suspicious for malignancy.
IMPRESSION: No mammographic evidence of malignancy. A result letter of this
screening mammogram will be mailed directly to the patient.

RECOMMENDATION:
Screening mammogram in one year. (Code:51-O-LD2)

BI-RADS CATEGORY  1: Negative.

## 2023-02-21 ENCOUNTER — Other Ambulatory Visit: Payer: Self-pay | Admitting: Internal Medicine

## 2023-02-21 DIAGNOSIS — E7800 Pure hypercholesterolemia, unspecified: Secondary | ICD-10-CM

## 2023-02-24 ENCOUNTER — Other Ambulatory Visit: Payer: Self-pay | Admitting: Internal Medicine

## 2023-02-24 DIAGNOSIS — F5101 Primary insomnia: Secondary | ICD-10-CM

## 2023-02-25 ENCOUNTER — Telehealth: Payer: Self-pay | Admitting: Internal Medicine

## 2023-02-25 NOTE — Telephone Encounter (Signed)
 Patient called and mentioned she was suppose to have an f/u but she wasn't sure why. I explained that if she is on any type of mediation than she has to been seen at least every 6 months. Patient stated that her insurance with [employer] only covers a cpe. Patient had last cpe 06/17/22, and I scheduled her another cpe for May. Patient mentioned that she may need refills before then, because [facility] said they will be reaching out for the refills.

## 2023-02-25 NOTE — Telephone Encounter (Signed)
 Requested Prescriptions  Pending Prescriptions Disp Refills   rosuvastatin  (CRESTOR ) 10 MG tablet [Pharmacy Med Name: ROSUVASTATIN  10MG  TABLETS] 90 tablet 0    Sig: TAKE 1 TABLET(10 MG) BY MOUTH DAILY     Cardiovascular:  Antilipid - Statins 2 Failed - 02/25/2023  4:33 PM      Failed - Lipid Panel in normal range within the last 12 months    Cholesterol, Total  Date Value Ref Range Status  06/17/2022 281 (H) 100 - 199 mg/dL Final   LDL Chol Calc (NIH)  Date Value Ref Range Status  06/17/2022 201 (H) 0 - 99 mg/dL Final   HDL  Date Value Ref Range Status  06/17/2022 52 >39 mg/dL Final   Triglycerides  Date Value Ref Range Status  06/17/2022 153 (H) 0 - 149 mg/dL Final         Passed - Cr in normal range and within 360 days    Creatinine, Ser  Date Value Ref Range Status  06/17/2022 0.84 0.57 - 1.00 mg/dL Final         Passed - Patient is not pregnant      Passed - Valid encounter within last 12 months    Recent Outpatient Visits           8 months ago Annual physical exam   [place] [facility] at [facility], Leita DEL, [place]   1 year ago Annual physical exam   [facility] at [facility], Leita DEL, [place]   2 years ago Prediabetes   [facility] at [facility], Leita DEL, MD   3 years ago Depression, major, recurrent, mild [facility])   [place] [facility] at [facility], Leita DEL, MD   4 years ago Hypercholesteremia   [facility] Kathrine HERO, [place]       Future Appointments             In 1 week Adie, Leita DEL, MD [facility] at [facility], [facility]   In 4 months Adie, Leita DEL, MD [facility] at [facility], [facility]

## 2023-02-25 NOTE — Telephone Encounter (Signed)
 Patient scheduled for next week.

## 2023-03-01 NOTE — Telephone Encounter (Signed)
 Requested medication (s) are due for refill today: Yes  Requested medication (s) are on the active medication list: Yes  Last refill:  11/18/22  Future visit scheduled: Yes  Notes to clinic:  Unable to refill per protocol, cannot delegate.      Requested Prescriptions  Pending Prescriptions Disp Refills   zaleplon  (SONATA ) 10 MG capsule [Pharmacy Med Name: ZALEPLON  10MG  CAPSULES] 90 capsule     Sig: TAKE 1 CAPSULE(10 MG) BY MOUTH AT BEDTIME AS NEEDED FOR SLEEP     Not Delegated - Psychiatry:  Anxiolytics/Hypnotics Failed - 03/01/2023  8:09 AM      Failed - This refill cannot be delegated      Failed - Urine Drug Screen completed in last 360 days      Failed - Valid encounter within last 6 months    Recent Outpatient Visits           8 months ago Annual physical exam   [place] [facility], Leita DEL, MD   1 year ago Annual physical exam   [facility] at [facility], Leita DEL, [place]   2 years ago Prediabetes   [facility] at [facility], Leita DEL, [place]   3 years ago Depression, major, recurrent, mild [facility])   [place] [facility] at [facility], Leita DEL, MD   4 years ago Hypercholesteremia   [facility]rine HERO, [place]       Future Appointments             In 4 days Adie, Leita DEL, MD [facility] at [facility], [facility]   In 4 months Adie, Leita DEL, MD [facility] at [facility], [facility]

## 2023-03-03 NOTE — Telephone Encounter (Signed)
 Please review.  KP

## 2023-03-05 ENCOUNTER — Encounter: Payer: Self-pay | Admitting: Internal Medicine

## 2023-03-05 ENCOUNTER — Ambulatory Visit (INDEPENDENT_AMBULATORY_CARE_PROVIDER_SITE_OTHER): Payer: Managed Care, Other (non HMO) | Admitting: Internal Medicine

## 2023-03-05 VITALS — BP 120/70 | HR 106 | Ht 63.0 in | Wt 193.2 lb

## 2023-03-05 DIAGNOSIS — E7800 Pure hypercholesterolemia, unspecified: Secondary | ICD-10-CM | POA: Diagnosis not present

## 2023-03-05 DIAGNOSIS — R7303 Prediabetes: Secondary | ICD-10-CM

## 2023-03-05 NOTE — Progress Notes (Signed)
 Date:  03/05/2023   Name:  Rebecca Greer   DOB:  1960/05/21   MRN:  [id number]   Chief Complaint: Hyperlipidemia and Diabetes  Hyperlipidemia This is a chronic problem. The problem is uncontrolled. Pertinent negatives include no chest pain or shortness of breath. Current antihyperlipidemic treatment includes statins (started recently).  Diabetes She presents for her follow-up diabetic visit. Diabetes type: prediabetes. Pertinent negatives for hypoglycemia include no dizziness or headaches. Pertinent negatives for diabetes include no chest pain, no fatigue and no weakness.    Review of Systems  Constitutional:  Negative for fatigue and unexpected weight change.  HENT:  Negative for nosebleeds.   Eyes:  Negative for visual disturbance.  Respiratory:  Negative for cough, chest tightness, shortness of breath and wheezing.   Cardiovascular:  Negative for chest pain, palpitations and leg swelling.  Gastrointestinal:  Negative for abdominal pain, constipation and diarrhea.  Neurological:  Negative for dizziness, weakness, light-headedness and headaches.     Lab Results  Component Value Date   NA 140 06/17/2022   K 4.6 06/17/2022   CO2 21 06/17/2022   GLUCOSE 100 (H) 06/17/2022   BUN 13 06/17/2022   CREATININE 0.84 06/17/2022   CALCIUM  9.5 06/17/2022   EGFR 80 06/17/2022   GFRNONAA 76 04/07/2020   Lab Results  Component Value Date   CHOL 281 (H) 06/17/2022   HDL 52 06/17/2022   LDLCALC 201 (H) 06/17/2022   TRIG 153 (H) 06/17/2022   CHOLHDL 5.4 (H) 06/17/2022   Lab Results  Component Value Date   TSH 2.750 06/17/2022   Lab Results  Component Value Date   HGBA1C 6.1 (H) 06/17/2022   Lab Results  Component Value Date   WBC 5.7 06/17/2022   HGB 14.4 06/17/2022   HCT 43.3 06/17/2022   MCV 87 06/17/2022   PLT 177 06/17/2022   Lab Results  Component Value Date   ALT 30 06/17/2022   AST 19 06/17/2022   ALKPHOS 68 06/17/2022   BILITOT 0.4 06/17/2022   Lab Results   Component Value Date   VD25OH 23.3 (L) 03/29/2021     Patient Active Problem List   Diagnosis Date Noted   BMI 35.0-35.9,adult 12/07/2019   Depression, major, single episode, complete remission (HCC) 11/04/2018   Atypical chest pain 07/04/2015   Prediabetes 11/16/2014   Adjustment disorder with anxiety 04/07/2009   Hypercholesteremia 12/23/2008   Allergic rhinitis 11/16/2008   Primary insomnia 08/18/2008    No Known Allergies  Past Surgical History:  Procedure Laterality Date   CHOLECYSTECTOMY      Social History   Tobacco Use   Smoking status: Former    Current packs/day: 0.00    Types: Cigarettes    Quit date: 02/24/1994    Years since quitting: 29.0   Smokeless tobacco: Never  Vaping Use   Vaping status: Never Used  Substance Use Topics   Alcohol use: Yes    Comment: Rarely   Drug use: No     Medication list has been reviewed and updated.  Current Meds  Medication Sig   escitalopram  (LEXAPRO ) 20 MG tablet TAKE 1 TABLET(20 MG) BY MOUTH DAILY   Multiple Vitamin (MULTI-VITAMIN PO) Take by mouth daily.   rosuvastatin  (CRESTOR ) 10 MG tablet TAKE 1 TABLET(10 MG) BY MOUTH DAILY   sodium chloride (OCEAN) 0.65 % SOLN nasal spray Place 1 spray into both nostrils as needed for congestion.   zaleplon  (SONATA ) 10 MG capsule TAKE 1 CAPSULE(10 MG) BY MOUTH AT  BEDTIME AS NEEDED FOR SLEEP       03/05/2023    9:01 AM 06/17/2022   10:04 AM 03/29/2021    8:46 AM 03/30/2020    9:18 AM  GAD 7 : Generalized Anxiety Score  Nervous, Anxious, on Edge 1 1 1  0  Control/stop worrying 1 0 1 0  Worry too much - different things 0 0 1 0  Trouble relaxing 0 0 0 0  Restless 0 0 0 0  Easily annoyed or irritable 3 2 3  0  Afraid - awful might happen 0 0 0 0  Total GAD 7 Score 5 3 6  0  Anxiety Difficulty Somewhat difficult Not difficult at all         03/05/2023    9:01 AM 06/17/2022   10:04 AM 03/29/2021    8:45 AM  Depression screen PHQ 2/9  Decreased Interest 2 2 1   Down,  Depressed, Hopeless 0 0 0  PHQ - 2 Score 2 2 1   Altered sleeping 2 0 1  Tired, decreased energy 3 2 3   Change in appetite 2 3 3   Feeling bad or failure about yourself  0 0 0  Trouble concentrating 0 1 1  Moving slowly or fidgety/restless 0 0 0  Suicidal thoughts 0 0 0  PHQ-9 Score 9 8 9   Difficult doing work/chores Not difficult at all Not difficult at all Somewhat difficult    BP Readings from Last 3 Encounters:  03/05/23 120/70  06/17/22 126/78  01/13/22 (!) 147/102    Physical Exam Vitals and nursing note reviewed.  Constitutional:      General: She is not in acute distress.    Appearance: She is well-developed.  HENT:     Head: Normocephalic and atraumatic.  Neck:     Vascular: No carotid bruit.  Cardiovascular:     Rate and Rhythm: Normal rate and regular rhythm.     Heart sounds: No murmur heard. Pulmonary:     Effort: Pulmonary effort is normal. No respiratory distress.     Breath sounds: No wheezing or rhonchi.  Musculoskeletal:     Cervical back: Normal range of motion.     Right lower leg: No edema.     Left lower leg: No edema.  Lymphadenopathy:     Cervical: No cervical adenopathy.  Skin:    General: Skin is warm and dry.     Findings: No rash.  Neurological:     General: No focal deficit present.     Mental Status: She is alert and oriented to person, place, and time.  Psychiatric:        Mood and Affect: Mood normal.        Behavior: Behavior normal.     Wt Readings from Last 3 Encounters:  03/05/23 193 lb 3.2 oz (87.6 kg)  06/17/22 194 lb 3.2 oz (88.1 kg)  01/13/22 189 lb (85.7 kg)    BP 120/70   Pulse (!) 106   Ht 5' 3 (1.6 m)   Wt 193 lb 3.2 oz (87.6 kg)   LMP 12/12/2016   SpO2 96%   BMI 34.22 kg/m   Assessment and Plan:  Problem List Items Addressed This Visit     Hypercholesteremia - Primary   Lipids were not controlled on pravastatin . Changed to Crestor  in April.   No medication side effects noted - in fact she has fewer  muscle aches. Due for repeat labs.      Relevant Orders   Comprehensive metabolic  panel   Lipid panel   Prediabetes   Blood sugar controlled with diet. Lab Results  Component Value Date   HGBA1C 6.1 (H) 06/17/2022          Relevant Orders   Hemoglobin A1c    No follow-ups on file.    Leita HILARIO Adie, MD [facility] [place]

## 2023-03-05 NOTE — Patient Instructions (Signed)
 Call [facility] to schedule your mammogram at [phone].

## 2023-03-05 NOTE — Assessment & Plan Note (Signed)
 Blood sugar controlled with diet. Lab Results  Component Value Date   HGBA1C 6.1 (H) 06/17/2022

## 2023-03-05 NOTE — Assessment & Plan Note (Addendum)
 Lipids were not controlled on pravastatin. Changed to Crestor in April.   No medication side effects noted - in fact she has fewer muscle aches. Due for repeat labs.

## 2023-03-06 LAB — COMPREHENSIVE METABOLIC PANEL
ALT: 50 [IU]/L — ABNORMAL HIGH (ref 0–32)
AST: 32 [IU]/L (ref 0–40)
Albumin: 4.5 g/dL (ref 3.8–4.9)
Alkaline Phosphatase: 74 [IU]/L (ref 44–121)
BUN/Creatinine Ratio: 21 (ref 12–28)
BUN: 16 mg/dL (ref 8–27)
Bilirubin Total: 0.5 mg/dL (ref 0.0–1.2)
CO2: 22 mmol/L (ref 20–29)
Calcium: 9.4 mg/dL (ref 8.7–10.3)
Chloride: 105 mmol/L (ref 96–106)
Creatinine, Ser: 0.76 mg/dL (ref 0.57–1.00)
Globulin, Total: 2.5 g/dL (ref 1.5–4.5)
Glucose: 117 mg/dL — ABNORMAL HIGH (ref 70–99)
Potassium: 4.5 mmol/L (ref 3.5–5.2)
Sodium: 141 mmol/L (ref 134–144)
Total Protein: 7 g/dL (ref 6.0–8.5)
eGFR: 90 mL/min/{1.73_m2} (ref >59)

## 2023-03-06 LAB — LIPID PANEL
Chol/HDL Ratio: 3.6 {ratio} (ref 0.0–4.4)
Cholesterol, Total: 185 mg/dL (ref 100–199)
HDL: 51 mg/dL (ref >39)
LDL Chol Calc (NIH): 107 mg/dL — ABNORMAL HIGH (ref 0–99)
Triglycerides: 156 mg/dL — ABNORMAL HIGH (ref 0–149)
VLDL Cholesterol Cal: 27 mg/dL (ref 5–40)

## 2023-03-06 LAB — HEMOGLOBIN A1C
Est. average glucose Bld gHb Est-mCnc: 128 mg/dL
Hgb A1c MFr Bld: 6.1 % — ABNORMAL HIGH (ref 4.8–5.6)

## 2023-05-28 ENCOUNTER — Other Ambulatory Visit: Payer: Self-pay | Admitting: Internal Medicine

## 2023-05-28 DIAGNOSIS — E7800 Pure hypercholesterolemia, unspecified: Secondary | ICD-10-CM

## 2023-05-29 NOTE — Telephone Encounter (Signed)
 OV 03/05/23 Requested Prescriptions  Pending Prescriptions Disp Refills   rosuvastatin (CRESTOR) 10 MG tablet [Pharmacy Med Name: ROSUVASTATIN 10MG  TABLETS] 90 tablet 0    Sig: TAKE 1 TABLET(10 MG) BY MOUTH DAILY     Cardiovascular:  Antilipid - Statins 2 Failed - 05/29/2023  1:53 PM      Failed - Valid encounter within last 12 months    Recent Outpatient Visits   None     Future Appointments             In 1 month Judithann Graves, Nyoka Cowden, MD [facility] at [facility], [facility]            Failed - Lipid Panel in normal range within the last 12 months    Cholesterol, Total  Date Value Ref Range Status  03/05/2023 185 100 - 199 mg/dL Final   LDL Chol Calc (NIH)  Date Value Ref Range Status  03/05/2023 107 (H) 0 - 99 mg/dL Final   HDL  Date Value Ref Range Status  03/05/2023 51 >39 mg/dL Final   Triglycerides  Date Value Ref Range Status  03/05/2023 156 (H) 0 - 149 mg/dL Final         Passed - Cr in normal range and within 360 days    Creatinine, Ser  Date Value Ref Range Status  03/05/2023 0.76 0.57 - 1.00 mg/dL Final         Passed - Patient is not pregnant

## 2023-07-04 ENCOUNTER — Encounter: Payer: Self-pay | Admitting: Internal Medicine

## 2023-09-02 ENCOUNTER — Other Ambulatory Visit: Payer: Self-pay | Admitting: Internal Medicine

## 2023-09-02 DIAGNOSIS — F5101 Primary insomnia: Secondary | ICD-10-CM

## 2023-09-04 ENCOUNTER — Encounter: Payer: Self-pay | Admitting: Internal Medicine

## 2023-09-04 NOTE — Telephone Encounter (Signed)
 Please review.  KP

## 2023-09-04 NOTE — Telephone Encounter (Signed)
 Requested medication (s) are due for refill today - yes  Requested medication (s) are on the active medication list -yes  Future visit scheduled -no  Last refill: 03/03/23 #90 1RF  Notes to clinic: non delegated Rx  Requested Prescriptions  Pending Prescriptions Disp Refills   zaleplon  (SONATA ) 10 MG capsule [Pharmacy Med Name: ZALEPLON  10MG  CAPSULES] 90 capsule     Sig: TAKE 1 CAPSULE(10 MG) BY MOUTH AT BEDTIME AS NEEDED FOR SLEEP     Not Delegated - Psychiatry:  Anxiolytics/Hypnotics Failed - 09/04/2023  1:03 PM      Failed - This refill cannot be delegated      Failed - Urine Drug Screen completed in last 360 days      Failed - Valid encounter within last 6 months    Recent Outpatient Visits   None               Requested Prescriptions  Pending Prescriptions Disp Refills   zaleplon  (SONATA ) 10 MG capsule [Pharmacy Med Name: ZALEPLON  10MG  CAPSULES] 90 capsule     Sig: TAKE 1 CAPSULE(10 MG) BY MOUTH AT BEDTIME AS NEEDED FOR SLEEP     Not Delegated - Psychiatry:  Anxiolytics/Hypnotics Failed - 09/04/2023  1:03 PM      Failed - This refill cannot be delegated      Failed - Urine Drug Screen completed in last 360 days      Failed - Valid encounter within last 6 months    Recent Outpatient Visits   None
